# Patient Record
Sex: Female | Born: 1975 | Race: Black or African American | Hispanic: No | Smoking: Former smoker
Health system: Southern US, Community
[De-identification: ages and names within clinical notes are randomized; demographics above are authoritative.]

## PROBLEM LIST (undated history)

## (undated) DIAGNOSIS — J302 Other seasonal allergic rhinitis: Secondary | ICD-10-CM

## (undated) DIAGNOSIS — I10 Essential (primary) hypertension: Secondary | ICD-10-CM

## (undated) DIAGNOSIS — F419 Anxiety disorder, unspecified: Secondary | ICD-10-CM

## (undated) DIAGNOSIS — F32A Depression, unspecified: Secondary | ICD-10-CM

## (undated) HISTORY — PX: BREAST SURGERY: SHX581

## (undated) HISTORY — PX: FOOT SURGERY: SHX648

---

## 2005-01-19 ENCOUNTER — Encounter: Admission: RE | Admit: 2005-01-19 | Discharge: 2005-01-19 | Payer: Self-pay | Admitting: Otolaryngology

## 2005-02-14 ENCOUNTER — Other Ambulatory Visit: Admission: RE | Admit: 2005-02-14 | Discharge: 2005-02-14 | Payer: Self-pay | Admitting: Otolaryngology

## 2010-01-09 DIAGNOSIS — E559 Vitamin D deficiency, unspecified: Secondary | ICD-10-CM | POA: Insufficient documentation

## 2015-07-19 DIAGNOSIS — J301 Allergic rhinitis due to pollen: Secondary | ICD-10-CM | POA: Insufficient documentation

## 2016-03-30 DIAGNOSIS — Z3169 Encounter for other general counseling and advice on procreation: Secondary | ICD-10-CM | POA: Insufficient documentation

## 2016-03-30 DIAGNOSIS — N6019 Diffuse cystic mastopathy of unspecified breast: Secondary | ICD-10-CM | POA: Insufficient documentation

## 2018-03-13 ENCOUNTER — Encounter (INDEPENDENT_AMBULATORY_CARE_PROVIDER_SITE_OTHER): Payer: Medicaid Other

## 2018-03-24 ENCOUNTER — Ambulatory Visit (INDEPENDENT_AMBULATORY_CARE_PROVIDER_SITE_OTHER): Payer: Self-pay | Admitting: Family Medicine

## 2018-03-24 DIAGNOSIS — N6019 Diffuse cystic mastopathy of unspecified breast: Secondary | ICD-10-CM | POA: Diagnosis not present

## 2018-03-24 DIAGNOSIS — Z91018 Allergy to other foods: Secondary | ICD-10-CM | POA: Insufficient documentation

## 2018-03-24 DIAGNOSIS — F438 Other reactions to severe stress: Secondary | ICD-10-CM | POA: Diagnosis not present

## 2018-03-24 DIAGNOSIS — N841 Polyp of cervix uteri: Secondary | ICD-10-CM | POA: Diagnosis not present

## 2018-03-24 DIAGNOSIS — E559 Vitamin D deficiency, unspecified: Secondary | ICD-10-CM | POA: Diagnosis not present

## 2018-03-24 DIAGNOSIS — Z0000 Encounter for general adult medical examination without abnormal findings: Secondary | ICD-10-CM | POA: Diagnosis not present

## 2018-03-24 DIAGNOSIS — S76111S Strain of right quadriceps muscle, fascia and tendon, sequela: Secondary | ICD-10-CM | POA: Diagnosis not present

## 2018-03-24 DIAGNOSIS — J301 Allergic rhinitis due to pollen: Secondary | ICD-10-CM | POA: Diagnosis not present

## 2018-03-24 DIAGNOSIS — M25562 Pain in left knee: Secondary | ICD-10-CM | POA: Diagnosis not present

## 2018-03-26 ENCOUNTER — Encounter (INDEPENDENT_AMBULATORY_CARE_PROVIDER_SITE_OTHER): Payer: Self-pay | Admitting: Family Medicine

## 2018-03-26 ENCOUNTER — Encounter (INDEPENDENT_AMBULATORY_CARE_PROVIDER_SITE_OTHER): Payer: Self-pay

## 2018-03-26 ENCOUNTER — Ambulatory Visit (INDEPENDENT_AMBULATORY_CARE_PROVIDER_SITE_OTHER): Payer: Self-pay | Admitting: Family Medicine

## 2018-04-04 DIAGNOSIS — Z1231 Encounter for screening mammogram for malignant neoplasm of breast: Secondary | ICD-10-CM | POA: Diagnosis not present

## 2018-04-18 ENCOUNTER — Encounter: Payer: Self-pay | Admitting: Rehabilitative and Restorative Service Providers"

## 2018-04-18 ENCOUNTER — Ambulatory Visit: Payer: 59 | Admitting: Rehabilitative and Restorative Service Providers"

## 2018-04-18 DIAGNOSIS — M25561 Pain in right knee: Secondary | ICD-10-CM

## 2018-04-18 DIAGNOSIS — R29898 Other symptoms and signs involving the musculoskeletal system: Secondary | ICD-10-CM | POA: Diagnosis not present

## 2018-04-18 DIAGNOSIS — M6281 Muscle weakness (generalized): Secondary | ICD-10-CM | POA: Diagnosis not present

## 2018-04-18 DIAGNOSIS — G8929 Other chronic pain: Secondary | ICD-10-CM | POA: Diagnosis not present

## 2018-04-18 DIAGNOSIS — M25562 Pain in left knee: Secondary | ICD-10-CM | POA: Diagnosis not present

## 2018-04-18 NOTE — Patient Instructions (Signed)
HIP: Hamstrings - Supine  Place strap around foot. Raise leg up, keeping knee straight.  Bend opposite knee to protect back if indicated. Hold 30 seconds. 3 reps per set, 2-3 sets per day  Outer Hip Stretch: Reclined IT Band Stretch (Strap) Right leg    Strap around one foot, pull leg across body until you feel a pull or stretch in the outside of your hip, with shoulders on mat. Hold for 30 seconds. Repeat 3 times each leg. 2-3 times/day.   For leg leg bring left leg out side 30 sec x 3 reps    Piriformis Stretch   Lying on back, pull right knee toward opposite shoulder. Hold 30 seconds. Repeat 3 times. Do 2-3 sessions per day.    Quads / HF, Prone KNEE: Quadriceps - Prone    Place strap around ankle. Bring ankle toward buttocks. Press hip into surface. Hold 30 seconds. Repeat 3 times per session. Do 2-3 sessions per day.    Avoid standing with knees locked!!!   TENS UNIT: This is helpful for muscle pain and spasm.   Search and Purchase a TENS 7000 2nd edition at www.tenspros.com. It should be less than $30.     TENS unit instructions: Do not shower or bathe with the unit on Turn the unit off before removing electrodes or batteries If the electrodes lose stickiness add a drop of water to the electrodes after they are disconnected from the unit and place on plastic sheet. If you continued to have difficulty, call the TENS unit company to purchase more electrodes. Do not apply lotion on the skin area prior to use. Make sure the skin is clean and dry as this will help prolong the life of the electrodes. After use, always check skin for unusual red areas, rash or other skin difficulties. If there are any skin problems, does not apply electrodes to the same area. Never remove the electrodes from the unit by pulling the wires. Do not use the TENS unit or electrodes other than as directed. Do not change electrode placement without consultating your therapist or  physician. Keep 2 fingers with between each electrode.

## 2018-04-18 NOTE — Therapy (Addendum)
[facility] [place], [place], [address] Phone: [phone]   Fax:  [phone]  Physical Therapy Evaluation  Patient Details  Name: Ann Gordon MRN: [id number] Date of Birth: [birth year] Referring Provider: Dr Sharyne Richters    Encounter Date: 04/18/2018  PT End of Session - 04/18/18 1441    Visit Number  1    Number of Visits  12    Date for PT Re-Evaluation  05/30/18    PT Start Time  1435    PT Stop Time  1542    PT Time Calculation (min)  67 min    Activity Tolerance  Patient tolerated treatment well       History reviewed. No pertinent past medical history.  History reviewed. No pertinent surgical history.  There were no vitals filed for this visit.   Subjective Assessment - 04/18/18 1343    Subjective  Injury to Lt knee ~ 2 yrs ago when he jumped down from about 6 ft height when she felt some pain in the Lt knee, pain increased a few days when she was doing lunges - treated with cortisone injection with some improvement for several months. She has now had increased pain over the past 5 months. Patient injured Rt knee ~ 1 yr ago with lunges during exercis with diagnosis of torn quad. This was treated with immobilizer and  bed rest for ~ 3-4 weeks with some improvement  and out of work for 2-3 months. Pain improved but started again when she lifted her father from the floor 3/19. Both knees are now hurting.     How long can you sit comfortably?  no limit    How long can you stand comfortably?  45 min     How long can you walk comfortably?  2 hours    Patient Stated Goals  strengthen knees - get rid of the pain     Currently in Pain?  Yes    Pain Score  7     Pain Location  Knee    Pain Orientation  Right    Pain Descriptors / Indicators  Burning;Nagging    Pain Type  Chronic pain    Pain Radiating Towards  lateral knee into anterior/lateral ankle     Pain Onset  More than a month ago    Pain  Frequency  Intermittent    Aggravating Factors   working all day - 8 hours; lifting    Pain Relieving Factors  rest; ice     Multiple Pain Sites  Yes    Pain Score  5    Pain Location  Knee    Pain Orientation  Left    Pain Descriptors / Indicators  Dull;Aching    Pain Type  Chronic pain    Pain Radiating Towards  medial knee area no radicular pain     Pain Onset  More than a month ago    Pain Frequency  Intermittent    Aggravating Factors   working 8 hr day; lifting     Pain Relieving Factors  rest; ice          [facility] PT Assessment - 04/18/18 0001      Assessment   Medical Diagnosis  Rt quad strain; Lt knee medial meniscus strain     Referring Provider  Dr Sharyne Richters     Onset Date/Surgical Date  01/17/18    Hand Dominance  Right    Next MD Visit  9/19    Prior Therapy  none       Precautions   Precautions  None      Balance Screen   Has the patient fallen in the past 6 months  No    Has the patient had a decrease in activity level because of a fear of falling?   No    Is the patient reluctant to leave their home because of a fear of falling?   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     Leisure  household chores; bowling      Observation/Other Assessments   Focus on Therapeutic Outcomes (FOTO)   20% limitation       Observation/Other Assessments-Edema    Edema  -- occ swelling Rt knee       Sensation   Additional Comments  WFL's per pt report       AROM   Right/Left Hip  -- tight rt rotation     Right Knee Extension  3    Right Knee Flexion  137    Left Knee Extension  5    Left Knee Flexion  127    Right/Left Ankle  --  [facility]       Strength   Right Hip Flexion  -- 5-/5    Right Hip Extension  -- 5-/5    Right Hip ABduction  -- 5-/5    Right Hip ADduction  5/5    Left Hip Flexion  5/5    Left Hip Extension  5/5    Left Hip ABduction  4+/5    Left Hip ADduction  -- 5-/5    Right Knee Flexion  4+/5    Right Knee Extension  4+/5    Left Knee Flexion  5/5    Left Knee Extension  5/5    Right/Left Ankle  -- WFL's bilat       Flexibility   Hamstrings  tight ~ 80 deg bilat     Quadriceps  tight bilat ~ 6 inch heel to buttocks     ITB  tight Rt    Piriformis  tight Rt       Palpation   Palpation comment  tight/banding lateral Rt quad proximal to joint line; medial Lt knee at insertion of adductors                 Objective measurements completed on examination: See above findings.      [place] Adult PT Treatment/Exercise - 04/18/18 0001      Self-Care   Self-Care  -- avoid hyperextension in standing; avoid crossing legs      Knee/Hip Exercises: Stretches   Passive Hamstring Stretch  Right;Left;2 reps;30 seconds supine with strap    Quad Stretch  Right;Left;2 reps;30 seconds prone with strap     ITB Stretch  Right;2 reps;30 seconds supine with strap     Piriformis Stretch  Right;Left;2 reps;30 seconds supine with strap     Other Knee/Hip Stretches  hip adductor stretch Lt supine with strap  Cryotherapy   Number Minutes Cryotherapy  20 Minutes    Cryotherapy Location  Knee bilat     Type of Cryotherapy  Ice pack      Electrical Stimulation   Electrical Stimulation Location  Rt lat knee; Lt medial knee     Electrical Stimulation Action  premod    Electrical Stimulation Parameters  to tolerance     Electrical Stimulation Goals  Pain;Tone             PT Education - 04/18/18 1432    Education Details  HEP TENS avoid crossing LE's avoid standing with knees hyperextended     Person(s) Educated  Patient    Methods  Explanation;Demonstration;Verbal cues;Handout     Comprehension  Verbalized understanding;Returned demonstration;Verbal cues required;Tactile cues required          PT Long Term Goals - 04/18/18 1446      PT LONG TERM GOAL #1   Title  Decrease pain in bilat knees to no greater than 2/10 with functional activities and at the end of her work day 05/30/18    Time  6    Period  Weeks    Status  New      PT LONG TERM GOAL #2   Title  Increase strength bilat LE's to 5/1 hips/knees with no pain with resistive testing 05/30/18    Time  6    Period  Weeks    Status  New      PT LONG TERM GOAL #3   Title  Improve tissue extensibilty with patient to demonstrate SLR to 85 deg bilat 05/30/18    Time  6    Period  Weeks    Status  New      PT LONG TERM GOAL #4   Title  Independent in HEP  05/30/18    Time  6    Period  Weeks    Status  New      PT LONG TERM GOAL #5   Title  Improve FOTO to </= 21% limiation 06/08/18    Time  6    Period  Weeks    Status  New             Plan - 04/18/18 1442    Clinical Impression Statement  Patient presents with injury to Rt/Lt knees 1-2 yrs ago with flare up in the past 3 months. Patient has lateral Rt knee pain with dx of quad tear/strain; medial Lt knee pain with dx of medial meniscus strain. She has muscular tightness bilat hips; weakness bilat LE's; muscular tightness and pain with palpation; pain with functional activities. Patient will benefit form PT to address problems identified.     Clinical Presentation  Stable    Clinical Presentation due to:  chronic nature of problems     Clinical Decision Making  Low    Rehab Potential  Good    PT Frequency  2x / week    PT Duration  6 weeks    PT Treatment/Interventions  Patient/family education;ADLs/Self Care Home Management;Cryotherapy;Electrical Stimulation;Iontophoresis 80m/ml Dexamethasone;Moist Heat;Ultrasound;Dry needling;Manual techniques;Neuromuscular re-education;Functional mobility training;Therapeutic activities;Therapeutic exercise     PT Next Visit Plan  review HEP; add strengthening exercises as tolerated; modalities and manual work as indicated     COncologistwith Plan of Care  Patient       Patient will benefit from skilled therapeutic intervention in order to improve the following deficits and impairments:  Postural dysfunction, Improper body mechanics, Pain,  Increased fascial restricitons, Increased muscle spasms, Decreased mobility, Decreased range of motion, Decreased strength, Decreased activity tolerance  Visit Diagnosis: Chronic pain of right knee - Plan: PT plan of care cert/re-cert  Chronic pain of left knee - Plan: PT plan of care cert/re-cert  Muscle weakness (generalized) - Plan: PT plan of care cert/re-cert  Other symptoms and signs involving the musculoskeletal system - Plan: PT plan of care cert/re-cert     Problem List There are no active problems to display for this patient.   [place], MPH  04/18/2018, 4:18 PM  [facility] [place] [address] [place], [place], [address] Phone: [phone]   Fax:  [phone]  Name: Ann Gordon MRN: [id number] Date of Birth: 25-Nov-1975   PHYSICAL THERAPY DISCHARGE SUMMARY  Visits from Start of Care: Eval only   Current functional level related to goals / functional outcomes: See note for discharge status    Remaining deficits: Unknown    Education / Equipment: HEP  Plan: Patient agrees to discharge.  Patient goals were not met. Patient is being discharged due to not returning since the last visit.  ?????     Celyn P. Helene Kelp PT, MPH 05/14/18 12:13 PM

## 2018-04-25 DIAGNOSIS — N6012 Diffuse cystic mastopathy of left breast: Secondary | ICD-10-CM | POA: Diagnosis not present

## 2018-04-25 DIAGNOSIS — R922 Inconclusive mammogram: Secondary | ICD-10-CM | POA: Diagnosis not present

## 2018-04-25 DIAGNOSIS — R928 Other abnormal and inconclusive findings on diagnostic imaging of breast: Secondary | ICD-10-CM | POA: Diagnosis not present

## 2018-04-25 DIAGNOSIS — N6002 Solitary cyst of left breast: Secondary | ICD-10-CM | POA: Diagnosis not present

## 2018-05-02 DIAGNOSIS — J309 Allergic rhinitis, unspecified: Secondary | ICD-10-CM | POA: Diagnosis not present

## 2018-05-09 ENCOUNTER — Encounter: Payer: Medicaid Other | Admitting: Rehabilitative and Restorative Service Providers"

## 2018-05-09 DIAGNOSIS — J301 Allergic rhinitis due to pollen: Secondary | ICD-10-CM | POA: Diagnosis not present

## 2018-05-20 ENCOUNTER — Ambulatory Visit (INDEPENDENT_AMBULATORY_CARE_PROVIDER_SITE_OTHER): Payer: Self-pay | Admitting: Family Medicine

## 2018-05-20 VITALS — BP 118/82 | HR 86 | Temp 99.5°F | Resp 20 | Wt 190.8 lb

## 2018-05-20 DIAGNOSIS — M25562 Pain in left knee: Secondary | ICD-10-CM

## 2018-05-20 DIAGNOSIS — M25462 Effusion, left knee: Secondary | ICD-10-CM

## 2018-05-20 MED ORDER — MELOXICAM 15 MG PO TABS: 15.0000 mg | ORAL_TABLET | Freq: Every day | ORAL | 0 refills | Status: DC

## 2018-05-20 MED ORDER — TRAMADOL HCL 50 MG PO TABS
50.0000 mg | ORAL_TABLET | Freq: Three times a day (TID) | ORAL | 0 refills | Status: DC | PRN
Start: 2018-05-20 — End: 2020-02-26

## 2018-05-20 NOTE — Patient Instructions (Signed)

## 2018-05-20 NOTE — Progress Notes (Signed)
Ann Gordon is a 41 y.o. female who presents today with concerns of left knee pain she reports began after a round of physical therapy to address a chronic injury to this knee that occurred 2 years ago. History was complicated by risk factors of length of injury that was initially traumatic and inflexible work schedule for appointments per patient reports which limited f/u initially after swelling began. Since the injury the patient has used a brace at home but not while working where much of stress and activity is occurring.She is ambulating unassisted without antalgic gait.   Review of Systems  Constitutional: Negative for chills, fever and malaise/fatigue.  HENT: Negative for congestion, ear discharge, ear pain, sinus pain and sore throat.   Eyes: Negative.   Respiratory: Negative for cough, sputum production and shortness of breath.   Cardiovascular: Negative.  Negative for chest pain.  Gastrointestinal: Negative for abdominal pain, diarrhea, nausea and vomiting.  Genitourinary: Negative for dysuria, frequency, hematuria and urgency.  Musculoskeletal: Negative for myalgias.  Skin: Negative.   Neurological: Negative for headaches.  Endo/Heme/Allergies: Negative.   Psychiatric/Behavioral: Negative.     O: Vitals:   05/20/18 1831  BP: 118/82  Pulse: 86  Resp: 20  Temp: 99.5 F (37.5 C)  SpO2: 98%     Physical Exam  Constitutional: She is oriented to person, place, and time. Vital signs are normal. She appears well-developed and well-nourished. She is active.  Non-toxic appearance. She does not have a sickly appearance.    Red- areas of swelling Blue- pinpoint area of pain  HENT:  Head: Normocephalic.  Right Ear: Hearing, tympanic membrane, external ear and ear canal normal.  Left Ear: Hearing, tympanic membrane, external ear and ear canal normal.  Nose: Nose normal.  Mouth/Throat: Uvula is midline and oropharynx is clear and moist.  Neck: Normal range of motion. Neck  supple.  Cardiovascular: Normal rate, regular rhythm, normal heart sounds and normal pulses.  Pulmonary/Chest: Effort normal and breath sounds normal.  Abdominal: Soft. Bowel sounds are normal.  Musculoskeletal: Normal range of motion.       Left knee: She exhibits swelling. Tenderness found. Medial joint line tenderness noted.  Patient with visual edema on exam that extends to foot- swelling makes 10-15% larger than unaffected leg on exam. No evidence of fluid shift- only pain point elicited is medial joint line tenderness limited to the anterior medial side. Posterior exam is unremarkable patient has full ROM and negative special test of lachman and drawer, patella apprehension negative and no patella laxity.  Lymphadenopathy:       Head (right side): No submental and no submandibular adenopathy present.       Head (left side): No submental and no submandibular adenopathy present.    She has no cervical adenopathy.  Neurological: She is alert and oriented to person, place, and time.  Psychiatric: She has a normal mood and affect.  Vitals reviewed.    A: 1. Acute pain of left knee   2. Pain and swelling of knee, left      P:  Advised f/u with local orthopedic due to reported historic meniscus injury and potential strain developed in these past weeks. Will provide support care in the form of NSAIDs and break through pain, enforcing rice therapy and use of supportive brace for support.  Exam findings, diagnosis etiology and medication use and indications reviewed with patient. Follow- Up and discharge instructions provided. No emergent/urgent issues found on exam.  Patient verbalized understanding of information provided  and agrees with plan of care (POC), all questions answered.  1. Acute pain of left knee - meloxicam (MOBIC) 15 MG tablet; Take 1 tablet (15 mg total) by mouth daily. - traMADol (ULTRAM) 50 MG tablet; Take 1 tablet (50 mg total) by mouth every 8 (eight) hours as needed  for moderate pain.  2. Pain and swelling of knee, left - meloxicam (MOBIC) 15 MG tablet; Take 1 tablet (15 mg total) by mouth daily. - traMADol (ULTRAM) 50 MG tablet; Take 1 tablet (50 mg total) by mouth every 8 (eight) hours as needed for moderate pain.

## 2018-05-21 MED FILL — traMADol HCL 50 MG TABS: 50 | 5 days supply | Qty: 15 | Fill #0

## 2018-05-21 MED FILL — MELOXICAM 15 MG TABLET: 15 | 30 days supply | Qty: 30 | Fill #0

## 2018-05-22 ENCOUNTER — Emergency Department
Admission: EM | Admit: 2018-05-22 | Discharge: 2018-05-22 | Disposition: A | Discharge status: Home / Self Care | Payer: 59 | Source: Home / Self Care | Attending: Family Medicine | Admitting: Family Medicine

## 2018-05-22 ENCOUNTER — Emergency Department (INDEPENDENT_AMBULATORY_CARE_PROVIDER_SITE_OTHER): Payer: 59

## 2018-05-22 ENCOUNTER — Encounter: Payer: Self-pay | Admitting: *Deleted

## 2018-05-22 ENCOUNTER — Other Ambulatory Visit: Payer: Self-pay

## 2018-05-22 DIAGNOSIS — M1712 Unilateral primary osteoarthritis, left knee: Secondary | ICD-10-CM

## 2018-05-22 DIAGNOSIS — M25462 Effusion, left knee: Secondary | ICD-10-CM | POA: Diagnosis not present

## 2018-05-22 DIAGNOSIS — M25562 Pain in left knee: Secondary | ICD-10-CM

## 2018-05-22 HISTORY — DX: Other seasonal allergic rhinitis: J30.2

## 2018-05-22 MED ORDER — HYDROCODONE-ACETAMINOPHEN 5-325 MG PO TABS: 1.0000 | ORAL_TABLET | ORAL | 0 refills | Status: DC | PRN

## 2018-05-22 MED ORDER — CYCLOBENZAPRINE HCL 10 MG PO TABS: 10.0000 mg | ORAL_TABLET | Freq: Two times a day (BID) | ORAL | 0 refills | Status: DC | PRN

## 2018-05-22 NOTE — ED Triage Notes (Signed)
Pt reports a hx of medial meniscus tear. She began PT 4 wks ago. She c/o increased LT knee pain, swelling, and weakness x 3 wks. [facility]. After wearing the brace yesterday last night she reports severe leg cramps.

## 2018-05-22 NOTE — ED Provider Notes (Signed)
[facility]    CSN: [id number] Arrival date & time: 05/22/18  0856     History   Chief Complaint Chief Complaint  Patient presents with  . Knee Pain    HPI Ann Gordon is a 42 y.o. female.   HPI  Ann Gordon is a 42 y.o. female presenting to UC with c/o Left medial knee pain that started about 4 weeks ago after 1 PT session. She had a medial meniscal tear about 2 years ago. It was recommended she have PT but she only received 1 knee injection and pain eventually resolved.  About 1 month ago she noticed her knee felt "weak" so she had her PCP schedule a PT appointment. She has been having pain since then.  She was seen at [facility] on 05/20/18, given a knee brace but had leg cramps last night after wearing the brace. She has tried Tramadol and Mobic with temporary relief. No new injuries.   Past Medical History:  Diagnosis Date  . Seasonal allergies     There are no active problems to display for this patient.   History reviewed. No pertinent surgical history.  OB History   None      Home Medications    Prior to Admission medications   Medication Sig Start Date End Date Taking? Authorizing Provider  levocetirizine (XYZAL) 5 MG tablet Take 5 mg by mouth every evening.   Yes [provider]  Multiple Vitamin (MULTIVITAMIN) tablet Take 1 tablet by mouth daily.   Yes [provider]  cyclobenzaprine (FLEXERIL) 10 MG tablet Take 1 tablet (10 mg total) by mouth 2 (two) times daily as needed. 05/22/18   Noe Gens, PA-C  HYDROcodone-acetaminophen (NORCO/VICODIN) 5-325 MG tablet Take 1 tablet by mouth every 4 (four) hours as needed. 05/22/18   Noe Gens, PA-C  meloxicam (MOBIC) 15 MG tablet Take 1 tablet (15 mg total) by mouth daily. 05/20/18   Shella Maxim, NP  traMADol (ULTRAM) 50 MG tablet Take 1 tablet (50 mg total) by mouth every 8 (eight) hours as needed for moderate pain. 05/20/18   Shella Maxim, NP    Family History Family History   Problem Relation Age of Onset  . Hypertension Mother   . Hypertension Father   . Diabetes Father     Social History Social History   Tobacco Use  . Smoking status: Former Research scientist (life sciences)  . Smokeless tobacco: Never Used  Substance Use Topics  . Alcohol use: Yes    Alcohol/week: 1.2 - 1.8 oz    Types: 2 - 3 Cans of beer per week  . Drug use: Never     Allergies   Cashew nut oil and Other   Review of Systems Review of Systems  Musculoskeletal: Positive for arthralgias and joint swelling. Negative for myalgias.  Skin: Negative for color change and wound.     Physical Exam Triage Vital Signs ED Triage Vitals [05/22/18 0929]  Enc Vitals Group     BP (!) 158/96     Pulse Rate 89     Resp 18     Temp 98.8 F (37.1 C)     Temp Source Oral     SpO2 100 %     Weight 190 lb (86.2 kg)     Height 5\' 3"  (1.6 m)     Head Circumference      Peak Flow      Pain Score 7     Pain Loc  Pain Edu?      Excl. in [place]?    No data found.  Updated Vital Signs BP (!) 158/96 (BP Location: Right Arm)   Pulse 89   Temp 98.8 F (37.1 C) (Oral)   Resp 18   Ht 5\' 3"  (1.6 m)   Wt 190 lb (86.2 kg)   LMP 05/14/2018   SpO2 100%   BMI 33.66 kg/m   Visual Acuity Right Eye Distance:   Left Eye Distance:   Bilateral Distance:    Right Eye Near:   Left Eye Near:    Bilateral Near:     Physical Exam  Constitutional: She is oriented to person, place, and time. She appears well-developed and well-nourished. No distress.  HENT:  Head: Normocephalic and atraumatic.  Eyes: EOM are normal.  Neck: Normal range of motion.  Cardiovascular: Normal rate.  Pulmonary/Chest: Effort normal.  Musculoskeletal: Normal range of motion. She exhibits edema and tenderness.  Left knee: mild edema. Tenderness to medial and posterior aspect. Full ROM. No crepitus.  Calf is soft, non-tender.  Neurological: She is alert and oriented to person, place, and time.  Skin: Skin is warm and dry. She is not  diaphoretic.  Left knee: skin in tact. No ecchymosis or erythema.   Psychiatric: She has a normal mood and affect. Her behavior is normal.  Nursing note and vitals reviewed.    UC Treatments / Results  Labs (all labs ordered are listed, but only abnormal results are displayed) Labs Reviewed - No data to display  EKG None  Radiology Dg Knee Complete 4 Views Left  Result Date: 05/22/2018 CLINICAL DATA:  Left knee pain and swelling over the last 3 weeks. Jumping injury. EXAM: LEFT KNEE - COMPLETE 4+ VIEW COMPARISON:  None. FINDINGS: Mild medial compartment joint space narrowing of marginal osteophytes. Small knee joint effusion. No other focal finding. IMPRESSION: Mild medial compartment osteoarthritic changes. Small joint effusion. No specific traumatic finding. Electronically Signed   By: Nelson Chimes M.D.   On: 05/22/2018 09:58    Procedures Procedures (including critical care time)  Medications Ordered in UC Medications - No data to display  Initial Impression / Assessment and Plan / UC Course  I have reviewed the triage vital signs and the nursing notes.  Pertinent labs & imaging results that were available during my care of the patient were reviewed by me and considered in my medical decision making (see chart for details).     Discussed imaging with pt.  Advised not to use knee brace anymore due to brace making pain worse. Pt declined crutches.  Home care instructions provided below.   Final Clinical Impressions(s) / UC Diagnoses   Final diagnoses:  Pain and swelling of left knee  Effusion of left knee     Discharge Instructions      Norco/Vicodin (hydrocodone-acetaminophen) is a narcotic pain medication, do not combine these medications with others containing tylenol. While taking, do not drink alcohol, drive, or perform any other activities that requires focus while taking these medications.   Flexeril is a muscle relaxer and may cause drowsiness. Do not drink  alcohol, drive, or operate heavy machinery while taking.  Do NOT take the Tramdaol while taking Hydrocodone, take one or the other as this can increase risk of accidental overdose.   Please call Dr. Landry Corporal office tomorrow for further evaluation and treatment.    ED Prescriptions    Medication Sig Dispense Auth. Provider   cyclobenzaprine (FLEXERIL) 10 MG tablet Take  1 tablet (10 mg total) by mouth 2 (two) times daily as needed. 20 tablet Noe Gens, PA-C   HYDROcodone-acetaminophen (NORCO/VICODIN) 5-325 MG tablet Take 1 tablet by mouth every 4 (four) hours as needed. 10 tablet Noe Gens, PA-C     Controlled Substance Prescriptions [place] Controlled Substance Registry consulted? Yes, I have consulted the [place] Controlled Substances Registry for this patient, and feel the risk/benefit ratio today is favorable for proceeding with this prescription for a controlled substance.   Noe Gens, [place] 05/22/18 1142

## 2018-05-22 NOTE — Discharge Instructions (Signed)
°  Norco/Vicodin (hydrocodone-acetaminophen) is a narcotic pain medication, do not combine these medications with others containing tylenol. While taking, do not drink alcohol, drive, or perform any other activities that requires focus while taking these medications.   Flexeril is a muscle relaxer and may cause drowsiness. Do not drink alcohol, drive, or operate heavy machinery while taking.  Do NOT take the Tramdaol while taking Hydrocodone, take one or the other as this can increase risk of accidental overdose.   Please call Dr. Landry Corporal office tomorrow for further evaluation and treatment.

## 2018-05-30 ENCOUNTER — Ambulatory Visit (INDEPENDENT_AMBULATORY_CARE_PROVIDER_SITE_OTHER): Payer: 59 | Admitting: Sports Medicine

## 2018-05-30 ENCOUNTER — Encounter: Payer: Self-pay | Admitting: Sports Medicine

## 2018-05-30 DIAGNOSIS — M1712 Unilateral primary osteoarthritis, left knee: Secondary | ICD-10-CM | POA: Diagnosis not present

## 2018-05-30 DIAGNOSIS — M170 Bilateral primary osteoarthritis of knee: Secondary | ICD-10-CM | POA: Insufficient documentation

## 2018-05-30 MED ORDER — MELOXICAM 15 MG PO TABS: ORAL_TABLET | ORAL | 3 refills | Status: DC

## 2018-05-30 NOTE — Progress Notes (Signed)
Subjective:    I'm seeing this patient as a consultation for: Dr. Sharyne Richters  CC: Left knee pain  HPI: This is a pleasant 42 year old female post partial meniscectomy on the left, more recently had a recurrence of pain, medial joint line with swelling, was seen in urgent care, she has been taking some ibuprofen without much relief.  Pain is moderate, persistent, localized at the medial joint line without radiation, no mechanical symptoms.  Swelling has improved with a compression wrap.   I reviewed the past medical history, family history, social history, surgical history, and allergies today and no changes were needed.  Please see the problem list section below in epic for further details.  Past Medical History: Past Medical History:  Diagnosis Date  . Seasonal allergies    Past Surgical History: No past surgical history on file. Social History: Social History   Socioeconomic History  . Marital status: Single    Spouse name: Not on file  . Number of children: Not on file  . Years of education: Not on file  . Highest education level: Not on file  Occupational History  . Not on file  Social Needs  . Financial resource strain: Not on file  . Food insecurity:    Worry: Not on file    Inability: Not on file  . Transportation needs:    Medical: Not on file    Non-medical: Not on file  Tobacco Use  . Smoking status: Former Research scientist (life sciences)  . Smokeless tobacco: Never Used  Substance and Sexual Activity  . Alcohol use: Yes    Alcohol/week: 1.2 - 1.8 oz    Types: 2 - 3 Cans of beer per week  . Drug use: Never  . Sexual activity: Not on file  Lifestyle  . Physical activity:    Days per week: Not on file    Minutes per session: Not on file  . Stress: Not on file  Relationships  . Social connections:    Talks on phone: Not on file    Gets together: Not on file    Attends religious service: Not on file    Active member of club or organization: Not on file    Attends meetings  of clubs or organizations: Not on file    Relationship status: Not on file  Other Topics Concern  . Not on file  Social History Narrative  . Not on file   Family History: Family History  Problem Relation Age of Onset  . Hypertension Mother   . Hypertension Father   . Diabetes Father    Allergies: Allergies  Allergen Reactions  . Cashew Nut Oil Anaphylaxis and Itching  . Other    Medications: See med rec.  Review of Systems: No headache, visual changes, nausea, vomiting, diarrhea, constipation, dizziness, abdominal pain, skin rash, fevers, chills, night sweats, weight loss, swollen lymph nodes, body aches, joint swelling, muscle aches, chest pain, shortness of breath, mood changes, visual or auditory hallucinations.   Objective:   General: Well Developed, well nourished, and in no acute distress.  Neuro:  Extra-ocular muscles intact, able to move all 4 extremities, sensation grossly intact.  Deep tendon reflexes tested were normal. Psych: Alert and oriented, mood congruent with affect. ENT:  Ears and nose appear unremarkable.  Hearing grossly normal. Neck: Unremarkable overall appearance, trachea midline.  No visible thyroid enlargement. Eyes: Conjunctivae and lids appear unremarkable.  Pupils equal and round. Skin: Warm and dry, no rashes noted.  Cardiovascular: Pulses palpable,  no extremity edema. Left knee: Minimal effusion with mild tenderness at the medial joint line ROM normal in flexion and extension and lower leg rotation. Ligaments with solid consistent endpoints including ACL, PCL, LCL, MCL. Negative Mcmurray's and provocative meniscal tests. Non painful patellar compression. Patellar and quadriceps tendons unremarkable. Hamstring and quadriceps strength is normal.  Procedure: Real-time Ultrasound Guided Injection of left knee Device: GE Logiq E  Verbal informed consent obtained.  Time-out conducted.  Noted no overlying erythema, induration, or other signs of  local infection.  Skin prepped in a sterile fashion.  Local anesthesia: Topical Ethyl chloride.  With sterile technique and under real time ultrasound guidance: Only minimal effusion, 25-gauge needle advanced into the suprapatellar recess, I then injected 1 cc Kenalog 40, 2 cc lidocaine, 2 cc bupivacaine. Completed without difficulty  Pain immediately resolved suggesting accurate placement of the medication.  Advised to call if fevers/chills, erythema, induration, drainage, or persistent bleeding.  Images permanently stored and available for review in the ultrasound unit.  Impression: Technically successful ultrasound guided injection.  Impression and Recommendations:   This case required medical decision making of moderate complexity.  Primary osteoarthritis of left knee Mild effusion, osteoarthritis on x-rays. Ibuprofen not sufficiently efficacious, injection as above, she will get an over-the-counter knee brace, rehab exercises given.   Meloxicam. Return to see me in 1 month. ___________________________________________ Gwen Her. Dianah Field, M.D., ABFM., CAQSM. Primary Care and [place] Instructor of [place] of [facility] of Medicine

## 2018-05-30 NOTE — Assessment & Plan Note (Signed)
Mild effusion, osteoarthritis on x-rays. Ibuprofen not sufficiently efficacious, injection as above, she will get an over-the-counter knee brace, rehab exercises given.   Meloxicam. Return to see me in 1 month.

## 2018-06-04 DIAGNOSIS — Z91018 Allergy to other foods: Secondary | ICD-10-CM | POA: Diagnosis not present

## 2018-06-04 DIAGNOSIS — Z319 Encounter for procreative management, unspecified: Secondary | ICD-10-CM | POA: Diagnosis not present

## 2018-06-04 DIAGNOSIS — N840 Polyp of corpus uteri: Secondary | ICD-10-CM | POA: Diagnosis not present

## 2018-06-04 DIAGNOSIS — J301 Allergic rhinitis due to pollen: Secondary | ICD-10-CM | POA: Diagnosis not present

## 2018-06-17 ENCOUNTER — Telehealth: Payer: Self-pay

## 2018-06-17 MED ORDER — DICLOFENAC SODIUM 1 % TD GEL: 4.0000 g | Freq: Four times a day (QID) | TRANSDERMAL | 11 refills | Status: DC

## 2018-06-17 MED FILL — DICLOFENAC SODIUM 1% GEL: 1 | 6 days supply | Qty: 100 | Fill #0

## 2018-06-17 NOTE — Telephone Encounter (Signed)
Dr Dianah Field patient.  Ann Gordon called and states the Mobic is helping a little. She would like to switch to diclofenac. She also still has swelling and wanted to know if there is something she can take for the swelling. Please advise.

## 2018-06-17 NOTE — Telephone Encounter (Signed)
Left VM with status update.  

## 2018-06-17 NOTE — Telephone Encounter (Signed)
Diclofenac gel sent to was in the long outpatient pharmacy.

## 2018-06-25 DIAGNOSIS — J3089 Other allergic rhinitis: Secondary | ICD-10-CM | POA: Diagnosis not present

## 2018-06-25 DIAGNOSIS — J3081 Allergic rhinitis due to animal (cat) (dog) hair and dander: Secondary | ICD-10-CM | POA: Diagnosis not present

## 2018-06-25 DIAGNOSIS — J301 Allergic rhinitis due to pollen: Secondary | ICD-10-CM | POA: Diagnosis not present

## 2018-06-27 ENCOUNTER — Ambulatory Visit (INDEPENDENT_AMBULATORY_CARE_PROVIDER_SITE_OTHER): Payer: 59 | Admitting: Sports Medicine

## 2018-06-27 ENCOUNTER — Encounter: Payer: Self-pay | Admitting: Sports Medicine

## 2018-06-27 DIAGNOSIS — M1712 Unilateral primary osteoarthritis, left knee: Secondary | ICD-10-CM

## 2018-06-27 DIAGNOSIS — G8929 Other chronic pain: Secondary | ICD-10-CM | POA: Diagnosis not present

## 2018-06-27 DIAGNOSIS — M25562 Pain in left knee: Secondary | ICD-10-CM

## 2018-06-27 MED ORDER — DICLOFENAC SODIUM 75 MG PO TBEC: 75.0000 mg | DELAYED_RELEASE_TABLET | Freq: Two times a day (BID) | ORAL | 3 refills | Status: AC

## 2018-06-27 NOTE — Progress Notes (Signed)
Subjective:    CC: Recheck knee  HPI: This is a pleasant 42 year old female, she has knee osteoarthritis, we did an aspiration and injection about a month ago, she was feeling pretty good, had a session in physical therapy that was somewhat aggressive, then developed a recurrence of pain and swelling.  Moderate, persistent, no mechanical symptoms.  She does also desire that we switch to diclofenac from meloxicam.  I reviewed the past medical history, family history, social history, surgical history, and allergies today and no changes were needed.  Please see the problem list section below in epic for further details.  Past Medical History: Past Medical History:  Diagnosis Date  . Seasonal allergies    Past Surgical History: No past surgical history on file. Social History: Social History   Socioeconomic History  . Marital status: Single    Spouse name: Not on file  . Number of children: Not on file  . Years of education: Not on file  . Highest education level: Not on file  Occupational History  . Not on file  Social Needs  . Financial resource strain: Not on file  . Food insecurity:    Worry: Not on file    Inability: Not on file  . Transportation needs:    Medical: Not on file    Non-medical: Not on file  Tobacco Use  . Smoking status: Former Research scientist (life sciences)  . Smokeless tobacco: Never Used  Substance and Sexual Activity  . Alcohol use: Yes    Alcohol/week: 2.0 - 3.0 standard drinks    Types: 2 - 3 Cans of beer per week  . Drug use: Never  . Sexual activity: Not on file  Lifestyle  . Physical activity:    Days per week: Not on file    Minutes per session: Not on file  . Stress: Not on file  Relationships  . Social connections:    Talks on phone: Not on file    Gets together: Not on file    Attends religious service: Not on file    Active member of club or organization: Not on file    Attends meetings of clubs or organizations: Not on file    Relationship status: Not  on file  Other Topics Concern  . Not on file  Social History Narrative  . Not on file   Family History: Family History  Problem Relation Age of Onset  . Hypertension Mother   . Hypertension Father   . Diabetes Father    Allergies: Allergies  Allergen Reactions  . Cashew Nut Oil Anaphylaxis and Itching  . Other    Medications: See med rec.  Review of Systems: No fevers, chills, night sweats, weight loss, chest pain, or shortness of breath.   Objective:    General: Well Developed, well nourished, and in no acute distress.  Neuro: Alert and oriented x3, extra-ocular muscles intact, sensation grossly intact.  HEENT: Normocephalic, atraumatic, pupils equal round reactive to light, neck supple, no masses, no lymphadenopathy, thyroid nonpalpable.  Skin: Warm and dry, no rashes. Cardiac: Regular rate and rhythm, no murmurs rubs or gallops, no lower extremity edema.  Respiratory: Clear to auscultation bilaterally. Not using accessory muscles, speaking in full sentences. Left knee: Normal to inspection with no erythema or effusion or obvious bony abnormalities. Moderate effusion with a palpable fluid wave ROM normal in flexion and extension and lower leg rotation. Ligaments with solid consistent endpoints including ACL, PCL, LCL, MCL. Negative Mcmurray's and provocative meniscal tests. Non painful  patellar compression. Patellar and quadriceps tendons unremarkable. Hamstring and quadriceps strength is normal.  Procedure: Real-time Ultrasound Guided aspiration of left knee Device: GE Logiq E  Verbal informed consent obtained.  Time-out conducted.  Noted no overlying erythema, induration, or other signs of local infection.  Skin prepped in a sterile fashion.  Local anesthesia: Topical Ethyl chloride.  With sterile technique and under real time ultrasound guidance: Using an 18-gauge needle I advanced into the suprapatellar recess and aspirated 37 mL of clear, straw-colored  fluid. Completed without difficulty  Pain immediately resolved suggesting accurate placement of the medication.  Advised to call if fevers/chills, erythema, induration, drainage, or persistent bleeding.  Images permanently stored and available for review in the ultrasound unit.  Impression: Technically successful ultrasound guided injection.  The knee was then strapped with a compressive dressing  Impression and Recommendations:    Primary osteoarthritis of left knee Severe recurrence of effusion, arthrocentesis today. MRI, switching to oral diclofenac.   ___________________________________________ Gwen Her. Dianah Field, M.D., ABFM., CAQSM. Primary Care and [place] Instructor of [place] of [facility] of Medicine

## 2018-06-27 NOTE — Assessment & Plan Note (Signed)
Severe recurrence of effusion, arthrocentesis today. MRI, switching to oral diclofenac.

## 2018-07-05 ENCOUNTER — Ambulatory Visit (HOSPITAL_BASED_OUTPATIENT_CLINIC_OR_DEPARTMENT_OTHER): Payer: 59

## 2018-07-12 ENCOUNTER — Ambulatory Visit (HOSPITAL_BASED_OUTPATIENT_CLINIC_OR_DEPARTMENT_OTHER)
Admission: RE | Admit: 2018-07-12 | Discharge: 2018-07-12 | Disposition: A | Discharge status: Home / Self Care | Payer: 59 | Source: Ambulatory Visit | Attending: Sports Medicine | Admitting: Sports Medicine

## 2018-07-12 DIAGNOSIS — M1712 Unilateral primary osteoarthritis, left knee: Secondary | ICD-10-CM | POA: Insufficient documentation

## 2018-07-12 DIAGNOSIS — S83242A Other tear of medial meniscus, current injury, left knee, initial encounter: Secondary | ICD-10-CM | POA: Diagnosis not present

## 2018-07-12 DIAGNOSIS — X58XXXA Exposure to other specified factors, initial encounter: Secondary | ICD-10-CM | POA: Diagnosis not present

## 2018-07-12 DIAGNOSIS — G8929 Other chronic pain: Secondary | ICD-10-CM | POA: Diagnosis not present

## 2018-07-12 DIAGNOSIS — M7642 Tibial collateral bursitis [Pellegrini-Stieda], left leg: Secondary | ICD-10-CM | POA: Diagnosis not present

## 2018-07-12 DIAGNOSIS — M25562 Pain in left knee: Secondary | ICD-10-CM | POA: Insufficient documentation

## 2018-07-12 DIAGNOSIS — M25462 Effusion, left knee: Secondary | ICD-10-CM | POA: Diagnosis not present

## 2018-07-14 DIAGNOSIS — N840 Polyp of corpus uteri: Secondary | ICD-10-CM | POA: Diagnosis not present

## 2018-07-14 DIAGNOSIS — N939 Abnormal uterine and vaginal bleeding, unspecified: Secondary | ICD-10-CM | POA: Diagnosis not present

## 2018-07-14 DIAGNOSIS — Z3202 Encounter for pregnancy test, result negative: Secondary | ICD-10-CM | POA: Diagnosis not present

## 2018-07-15 ENCOUNTER — Encounter: Payer: Self-pay | Admitting: Sports Medicine

## 2018-07-15 DIAGNOSIS — M1712 Unilateral primary osteoarthritis, left knee: Secondary | ICD-10-CM

## 2018-07-23 MED FILL — DICLOFENAC SODIUM 1% GEL: 1 | 6 days supply | Qty: 100 | Fill #1

## 2018-07-28 DIAGNOSIS — E669 Obesity, unspecified: Secondary | ICD-10-CM | POA: Diagnosis not present

## 2018-07-28 DIAGNOSIS — F438 Other reactions to severe stress: Secondary | ICD-10-CM | POA: Diagnosis not present

## 2018-07-28 DIAGNOSIS — M62838 Other muscle spasm: Secondary | ICD-10-CM | POA: Diagnosis not present

## 2018-07-28 DIAGNOSIS — J301 Allergic rhinitis due to pollen: Secondary | ICD-10-CM | POA: Diagnosis not present

## 2018-07-28 DIAGNOSIS — M25562 Pain in left knee: Secondary | ICD-10-CM | POA: Diagnosis not present

## 2018-07-28 DIAGNOSIS — J3089 Other allergic rhinitis: Secondary | ICD-10-CM | POA: Diagnosis not present

## 2018-07-28 DIAGNOSIS — J3081 Allergic rhinitis due to animal (cat) (dog) hair and dander: Secondary | ICD-10-CM | POA: Diagnosis not present

## 2018-08-05 DIAGNOSIS — J3081 Allergic rhinitis due to animal (cat) (dog) hair and dander: Secondary | ICD-10-CM | POA: Diagnosis not present

## 2018-08-05 DIAGNOSIS — J3089 Other allergic rhinitis: Secondary | ICD-10-CM | POA: Diagnosis not present

## 2018-08-05 DIAGNOSIS — J301 Allergic rhinitis due to pollen: Secondary | ICD-10-CM | POA: Diagnosis not present

## 2018-08-08 DIAGNOSIS — M25562 Pain in left knee: Secondary | ICD-10-CM | POA: Diagnosis not present

## 2018-08-14 DIAGNOSIS — J301 Allergic rhinitis due to pollen: Secondary | ICD-10-CM | POA: Diagnosis not present

## 2018-08-14 DIAGNOSIS — J3081 Allergic rhinitis due to animal (cat) (dog) hair and dander: Secondary | ICD-10-CM | POA: Diagnosis not present

## 2018-08-14 DIAGNOSIS — J3089 Other allergic rhinitis: Secondary | ICD-10-CM | POA: Diagnosis not present

## 2018-08-21 DIAGNOSIS — M1712 Unilateral primary osteoarthritis, left knee: Secondary | ICD-10-CM | POA: Diagnosis not present

## 2018-08-21 DIAGNOSIS — M25562 Pain in left knee: Secondary | ICD-10-CM | POA: Diagnosis not present

## 2018-08-21 DIAGNOSIS — M23204 Derangement of unspecified medial meniscus due to old tear or injury, left knee: Secondary | ICD-10-CM | POA: Diagnosis not present

## 2018-08-21 DIAGNOSIS — S8992XA Unspecified injury of left lower leg, initial encounter: Secondary | ICD-10-CM | POA: Insufficient documentation

## 2018-08-21 DIAGNOSIS — M25462 Effusion, left knee: Secondary | ICD-10-CM | POA: Diagnosis not present

## 2018-08-27 DIAGNOSIS — J3089 Other allergic rhinitis: Secondary | ICD-10-CM | POA: Diagnosis not present

## 2018-08-27 DIAGNOSIS — J301 Allergic rhinitis due to pollen: Secondary | ICD-10-CM | POA: Diagnosis not present

## 2018-08-27 DIAGNOSIS — J3081 Allergic rhinitis due to animal (cat) (dog) hair and dander: Secondary | ICD-10-CM | POA: Diagnosis not present

## 2018-09-03 DIAGNOSIS — J301 Allergic rhinitis due to pollen: Secondary | ICD-10-CM | POA: Diagnosis not present

## 2018-09-03 DIAGNOSIS — J3089 Other allergic rhinitis: Secondary | ICD-10-CM | POA: Diagnosis not present

## 2018-09-03 DIAGNOSIS — J3081 Allergic rhinitis due to animal (cat) (dog) hair and dander: Secondary | ICD-10-CM | POA: Diagnosis not present

## 2018-09-23 DIAGNOSIS — J3089 Other allergic rhinitis: Secondary | ICD-10-CM | POA: Diagnosis not present

## 2018-09-23 DIAGNOSIS — J301 Allergic rhinitis due to pollen: Secondary | ICD-10-CM | POA: Diagnosis not present

## 2018-09-23 DIAGNOSIS — J3081 Allergic rhinitis due to animal (cat) (dog) hair and dander: Secondary | ICD-10-CM | POA: Diagnosis not present

## 2018-09-29 DIAGNOSIS — J301 Allergic rhinitis due to pollen: Secondary | ICD-10-CM | POA: Diagnosis not present

## 2018-09-29 DIAGNOSIS — J3081 Allergic rhinitis due to animal (cat) (dog) hair and dander: Secondary | ICD-10-CM | POA: Diagnosis not present

## 2018-09-29 DIAGNOSIS — J3089 Other allergic rhinitis: Secondary | ICD-10-CM | POA: Diagnosis not present

## 2018-10-20 DIAGNOSIS — J3089 Other allergic rhinitis: Secondary | ICD-10-CM | POA: Diagnosis not present

## 2018-10-20 DIAGNOSIS — J3081 Allergic rhinitis due to animal (cat) (dog) hair and dander: Secondary | ICD-10-CM | POA: Diagnosis not present

## 2018-10-20 DIAGNOSIS — J301 Allergic rhinitis due to pollen: Secondary | ICD-10-CM | POA: Diagnosis not present

## 2018-10-21 DIAGNOSIS — J3081 Allergic rhinitis due to animal (cat) (dog) hair and dander: Secondary | ICD-10-CM | POA: Diagnosis not present

## 2018-10-21 DIAGNOSIS — N6012 Diffuse cystic mastopathy of left breast: Secondary | ICD-10-CM | POA: Diagnosis not present

## 2018-10-21 DIAGNOSIS — J3089 Other allergic rhinitis: Secondary | ICD-10-CM | POA: Diagnosis not present

## 2018-10-21 DIAGNOSIS — N6489 Other specified disorders of breast: Secondary | ICD-10-CM | POA: Diagnosis not present

## 2018-10-21 DIAGNOSIS — J301 Allergic rhinitis due to pollen: Secondary | ICD-10-CM | POA: Diagnosis not present

## 2018-10-29 DIAGNOSIS — M25562 Pain in left knee: Secondary | ICD-10-CM | POA: Diagnosis not present

## 2018-10-29 DIAGNOSIS — E669 Obesity, unspecified: Secondary | ICD-10-CM | POA: Diagnosis not present

## 2018-10-29 DIAGNOSIS — G8929 Other chronic pain: Secondary | ICD-10-CM | POA: Diagnosis not present

## 2018-10-29 DIAGNOSIS — R030 Elevated blood-pressure reading, without diagnosis of hypertension: Secondary | ICD-10-CM | POA: Diagnosis not present

## 2018-10-29 DIAGNOSIS — K5900 Constipation, unspecified: Secondary | ICD-10-CM | POA: Diagnosis not present

## 2018-10-30 DIAGNOSIS — J3089 Other allergic rhinitis: Secondary | ICD-10-CM | POA: Diagnosis not present

## 2018-10-30 DIAGNOSIS — J301 Allergic rhinitis due to pollen: Secondary | ICD-10-CM | POA: Diagnosis not present

## 2018-10-30 DIAGNOSIS — J3081 Allergic rhinitis due to animal (cat) (dog) hair and dander: Secondary | ICD-10-CM | POA: Diagnosis not present

## 2018-11-05 DIAGNOSIS — J3081 Allergic rhinitis due to animal (cat) (dog) hair and dander: Secondary | ICD-10-CM | POA: Diagnosis not present

## 2018-11-05 DIAGNOSIS — J301 Allergic rhinitis due to pollen: Secondary | ICD-10-CM | POA: Diagnosis not present

## 2018-11-05 DIAGNOSIS — N926 Irregular menstruation, unspecified: Secondary | ICD-10-CM | POA: Diagnosis not present

## 2018-11-05 DIAGNOSIS — N979 Female infertility, unspecified: Secondary | ICD-10-CM | POA: Diagnosis not present

## 2018-11-05 DIAGNOSIS — J3089 Other allergic rhinitis: Secondary | ICD-10-CM | POA: Diagnosis not present

## 2018-11-13 DIAGNOSIS — J3089 Other allergic rhinitis: Secondary | ICD-10-CM | POA: Diagnosis not present

## 2018-11-13 DIAGNOSIS — J3081 Allergic rhinitis due to animal (cat) (dog) hair and dander: Secondary | ICD-10-CM | POA: Diagnosis not present

## 2018-11-13 DIAGNOSIS — J301 Allergic rhinitis due to pollen: Secondary | ICD-10-CM | POA: Diagnosis not present

## 2019-01-09 MED FILL — MELOXICAM 15 MG TABLET: 15 | 30 days supply | Qty: 30 | Fill #0

## 2019-04-17 IMAGING — MR MR KNEE*L* W/O CM
5 of 6 series · 32 of 40 positions shown · non-contrast
Comparison: Radiographs from 05/22/2018

CLINICAL DATA: Medial and lateral left knee pain and swelling.

EXAM:
MRI OF THE LEFT KNEE WITHOUT CONTRAST
TECHNIQUE: Multiplanar, multisequence MR imaging of the knee was performed. No
intravenous contrast was administered.

[Series 3: T2 fat-sat · axial · 4.0mm · 0.66mm/px · z∈[-82,+38]mm · 6 of 25 slices shown (1 of 3)]
[im 1/25]
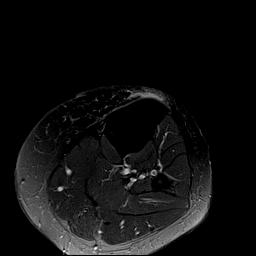
[im 5/25]
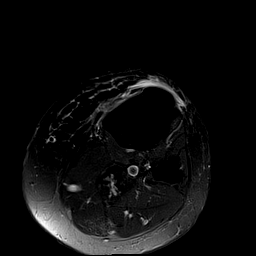
[im 10/25]
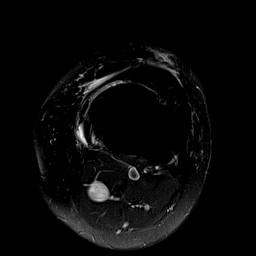
[im 15/25]
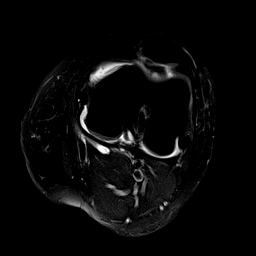
[im 20/25]
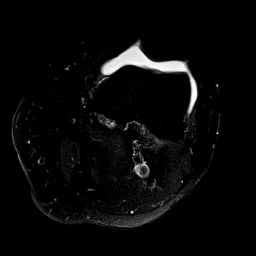
[im 25/25]
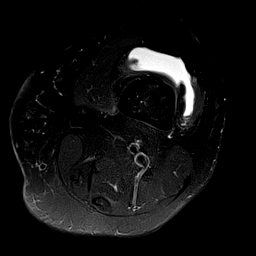

[Series 5: T2 fat-sat · coronal · 4.0mm · 0.62mm/px · 6 of 24 slices shown (2 of 3)]
[im 1/24]
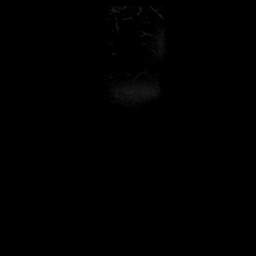
[im 5/24]
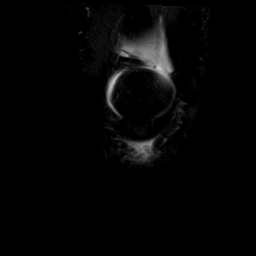
[im 10/24]
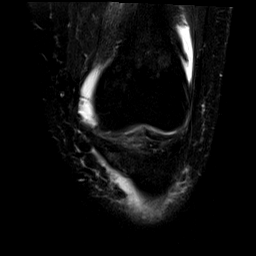
[im 14/24]
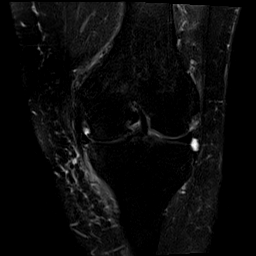
[im 19/24]
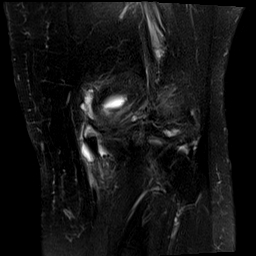
[im 24/24]
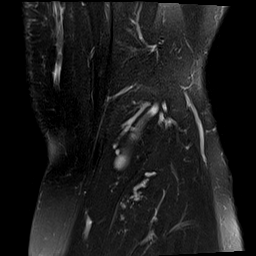

[Series 6: PD fat-sat · coronal · 3.0mm · 0.62mm/px · 8 of 30 slices shown (1 of 2)]
[im 1/30]
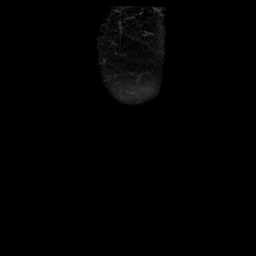
[im 5/30]
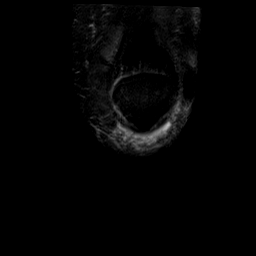
[im 9/30]
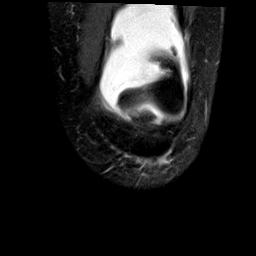
[im 13/30]
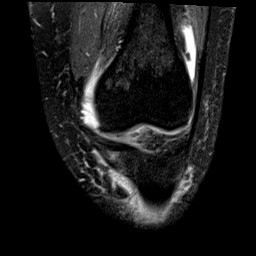
[im 17/30]
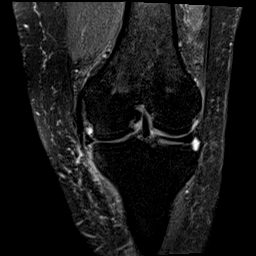
[im 21/30]
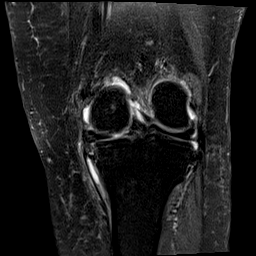
[im 25/30]
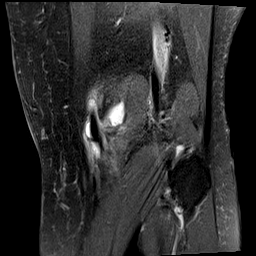
[im 30/30]
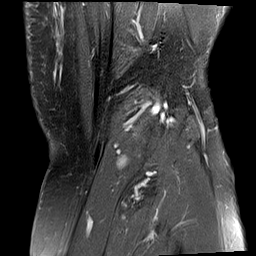

[Series 7: PD fat-sat · sagittal · 3.0mm · 0.31mm/px · 7 of 28 slices shown (2 of 2)]
[im 1/28]
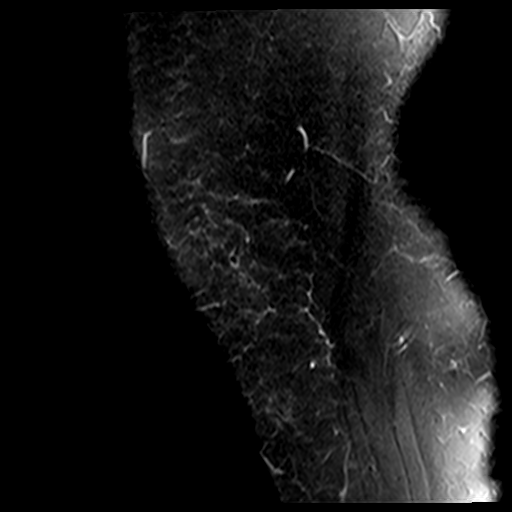
[im 5/28]
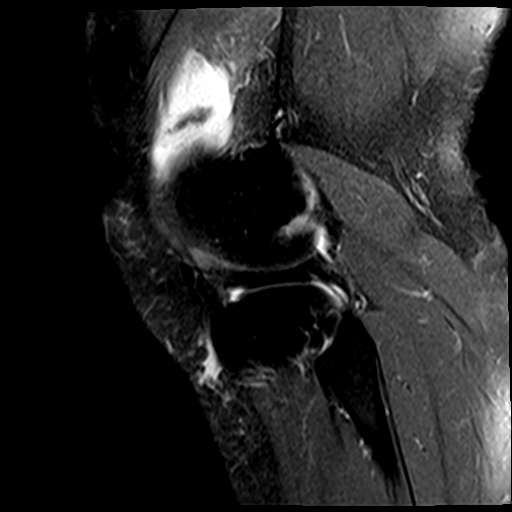
[im 10/28]
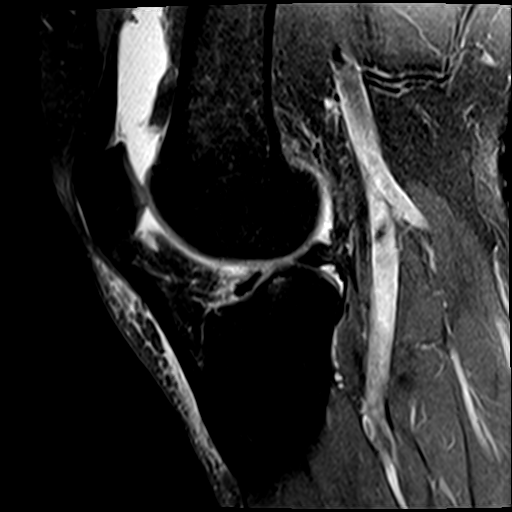
[im 14/28]
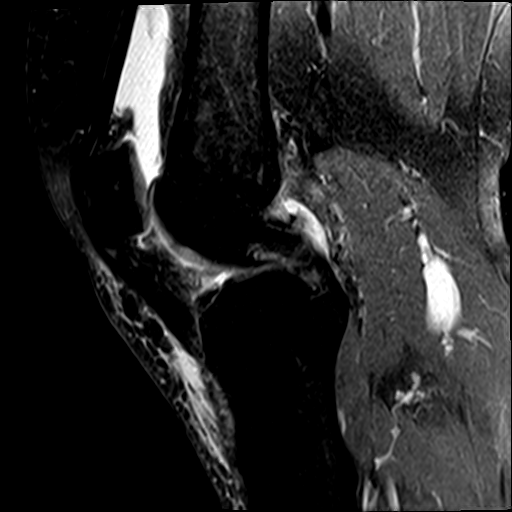
[im 19/28]
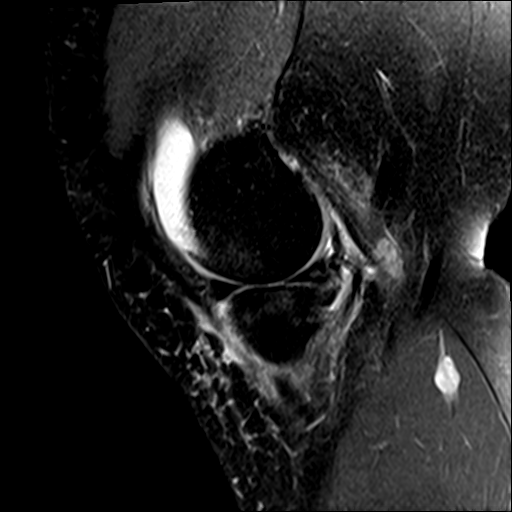
[im 23/28]
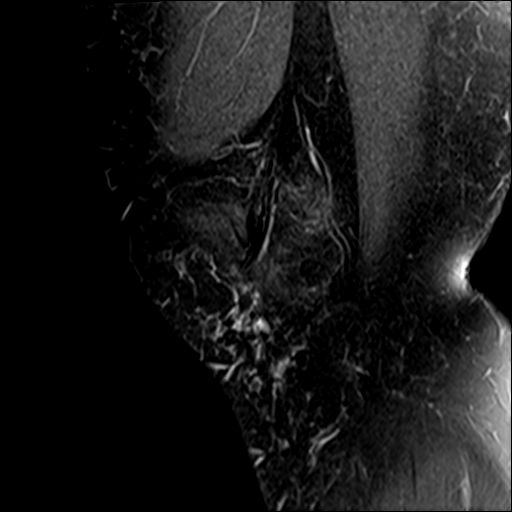
[im 28/28]
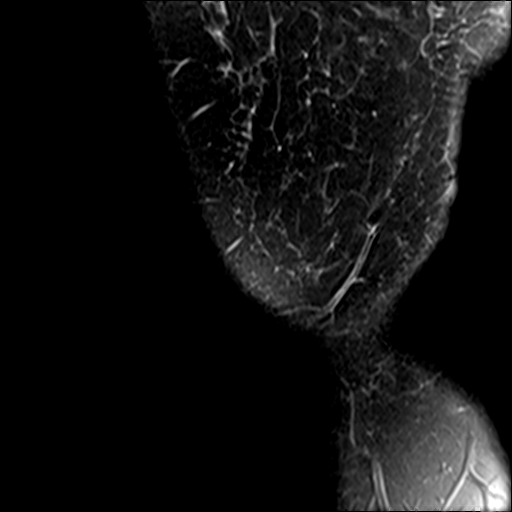

[Series 8: T2 fat-sat · sagittal · 3.0mm · 0.31mm/px · 5 of 28 slices shown (3 of 3)]
[im 1/28]
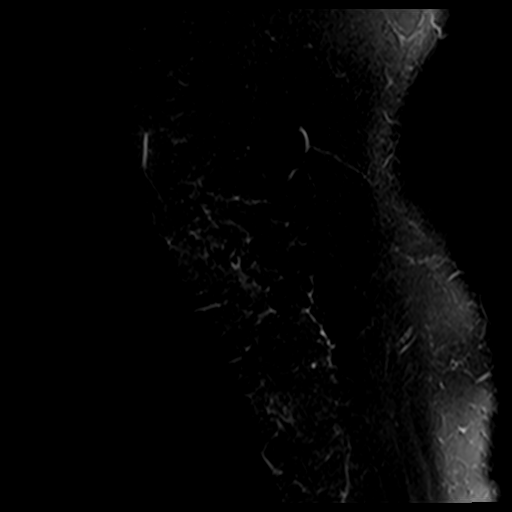
[im 5/28]
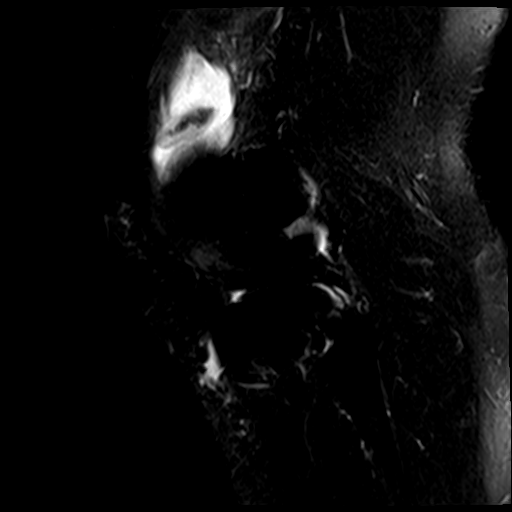
[im 10/28]
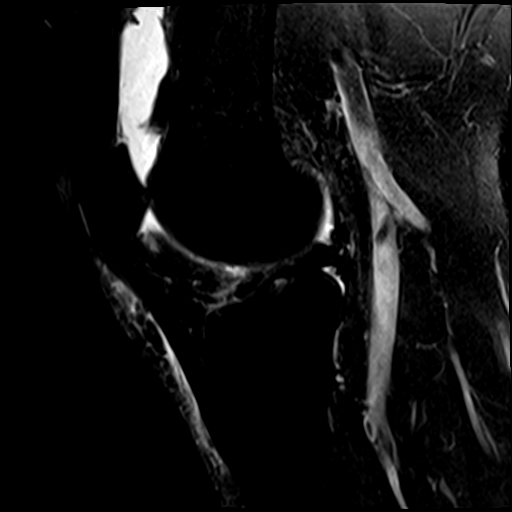
[im 14/28]
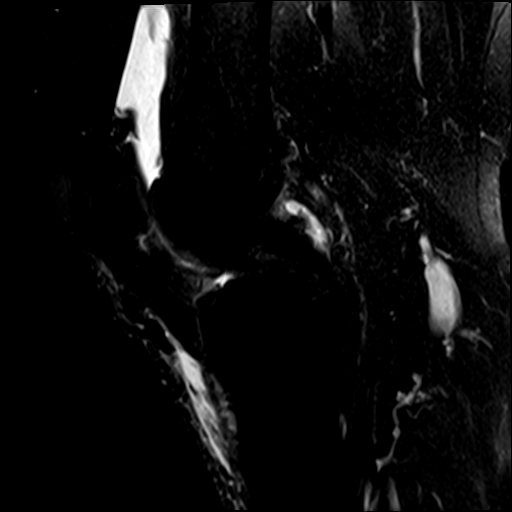
[im 19/28]
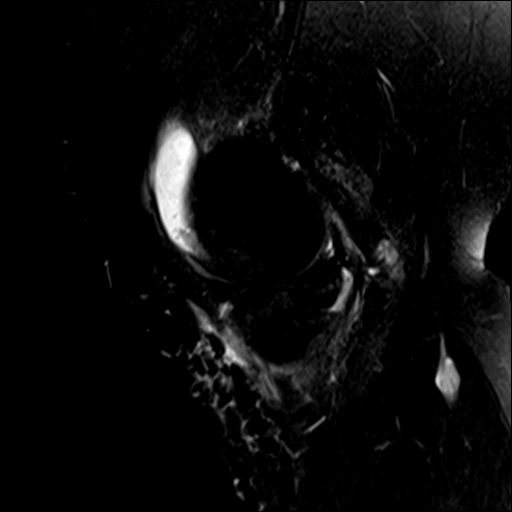

[32 of 40 positions shown; findings below may reference images not displayed]

FINDINGS: Despite efforts by the technologist and patient, motion artifact is
present on today's exam and could not be eliminated. This reduces
exam sensitivity and specificity.

MENISCI

Medial meniscus: There is a large radial tear of the posterior horn
medial meniscus near the meniscal root. Phantom meniscus sign on
image [DATE]. Adjacent grade 1 signal in the posterior horn noted.
Peripheral meniscal extrusion due to loss of articular space and
marginal spurring.

Lateral meniscus:  Unremarkable

LIGAMENTS

Cruciates:  Unremarkable

Collaterals: Mildly thickened MCL the especially proximally
suggesting a prior MCL injury. Fibular collateral ligament intact.

CARTILAGE

Patellofemoral:  Unremarkable

Medial: Prominent medial compartmental narrowing with marginal
spurring. Faint subcortical marrow edema along the medial tibial
plateau.

Lateral:  Unremarkable

Joint:  Large knee effusion.

Popliteal Fossa: Small Baker's cyst. There is also
semimembranosus-tibial collateral ligament bursitis.

Extensor Mechanism: Subcutaneous edema anterior to the patellar
tendon.

Bones: No significant extra-articular osseous abnormalities
identified.

Other: No supplemental non-categorized findings.
IMPRESSION: 1. Large radial tear of the posterior horn medial meniscus near the
meniscal root.
2. Prominent medial compartmental articular space narrowing with
marginal spurring causing meniscal extrusion.
3. Mildly thickened MCL proximally suggesting a remote MCL injury.
4. Large knee effusion with small Baker's cyst.
5. Semimembranosus-tibial collateral ligament bursitis.

## 2019-05-07 ENCOUNTER — Encounter: Payer: Self-pay | Admitting: Podiatry

## 2019-05-07 ENCOUNTER — Ambulatory Visit: Payer: BC Managed Care – PPO | Admitting: Podiatry

## 2019-05-07 ENCOUNTER — Ambulatory Visit (INDEPENDENT_AMBULATORY_CARE_PROVIDER_SITE_OTHER): Payer: BC Managed Care – PPO

## 2019-05-07 ENCOUNTER — Other Ambulatory Visit: Payer: Self-pay | Admitting: Podiatry

## 2019-05-07 ENCOUNTER — Other Ambulatory Visit: Payer: Self-pay

## 2019-05-07 VITALS — BP 131/86 | HR 87 | Temp 98.0°F | Resp 16

## 2019-05-07 DIAGNOSIS — M7752 Other enthesopathy of left foot: Secondary | ICD-10-CM

## 2019-05-07 DIAGNOSIS — Q828 Other specified congenital malformations of skin: Secondary | ICD-10-CM | POA: Diagnosis not present

## 2019-05-07 DIAGNOSIS — M779 Enthesopathy, unspecified: Secondary | ICD-10-CM

## 2019-05-07 DIAGNOSIS — M778 Other enthesopathies, not elsewhere classified: Secondary | ICD-10-CM

## 2019-05-07 DIAGNOSIS — T8484XA Pain due to internal orthopedic prosthetic devices, implants and grafts, initial encounter: Secondary | ICD-10-CM

## 2019-05-07 NOTE — Progress Notes (Signed)
Subjective:  Patient ID: Ann Gordon, female    DOB: [birth year],  MRN: [id number] HPI Chief Complaint  Patient presents with  . Foot Pain    Lateral 5th MPJ left - callused area x 6 weeks, gotten thicker and more painful with shoes and walking, tried trimming  . Ankle Pain    Concerned about the swelling in ankle left - previous surgery - screw fixation and worried about irritation  . New Patient (Initial Visit)    43 y.o. female presents with the above complaint.   ROS: Denies fever chills nausea vomiting muscle aches pains calf pain back pain chest pain shortness of breath.  Past Medical History:  Diagnosis Date  . Seasonal allergies    No past surgical history on file.  Current Outpatient Medications:  .  phentermine 37.5 MG capsule, Take by mouth., Disp: , Rfl:  .  azelastine (ASTELIN) 0.1 % nasal spray, USE 1 (ONE) SPRAY EACH NOSTRIL TWO TIMES DAILY, Disp: , Rfl:  .  buPROPion (WELLBUTRIN XL) 150 MG 24 hr tablet, TITRATE TO 3 TABLETS IN THE MORNING AS DIRECTED, Disp: , Rfl:  .  cyclobenzaprine (FLEXERIL) 10 MG tablet, Take 1 tablet (10 mg total) by mouth 2 (two) times daily as needed., Disp: 20 tablet, Rfl: 0 .  diclofenac (VOLTAREN) 75 MG EC tablet, Take 1 tablet (75 mg total) by mouth 2 (two) times daily., Disp: 60 tablet, Rfl: 3 .  diclofenac sodium (VOLTAREN) 1 % GEL, Apply 4 g topically 4 (four) times daily. To affected joint., Disp: 100 g, Rfl: 11 .  fluticasone (FLONASE) 50 MCG/ACT nasal spray, , Disp: , Rfl:  .  hydrochlorothiazide (HYDRODIURIL) 12.5 MG tablet, TAKE ONE TABLET (12.5 MG DOSE) BY MOUTH EVERY MORNING., Disp: , Rfl:  .  HYDROcodone-acetaminophen (NORCO/VICODIN) 5-325 MG tablet, Take 1 tablet by mouth every 4 (four) hours as needed., Disp: 10 tablet, Rfl: 0 .  letrozole (FEMARA) 2.5 MG tablet, , Disp: , Rfl:  .  levocetirizine (XYZAL) 5 MG tablet, Take 5 mg by mouth every evening., Disp: , Rfl:  .  meloxicam (MOBIC) 15 MG tablet, , Disp: , Rfl:  .   Multiple Vitamin (MULTIVITAMIN) tablet, Take 1 tablet by mouth daily., Disp: , Rfl:  .  propranolol (INDERAL) 10 MG tablet, TAKE 1 TABLET EVERY 8 HOURS IF NEEDED FOR PALPITATION OR ELEVATION IN BLOOD PRESSURE DUE TO STRESS, Disp: , Rfl:  .  traMADol (ULTRAM) 50 MG tablet, Take 1 tablet (50 mg total) by mouth every 8 (eight) hours as needed for moderate pain., Disp: 15 tablet, Rfl: 0  Allergies  Allergen Reactions  . Cashew Nut Oil Anaphylaxis and Itching  . Other    Review of Systems Objective:   Vitals:   05/07/19 1622  BP: 131/86  Pulse: 87  Resp: 16  Temp: 98 F (36.7 C)    General: Well developed, nourished, in no acute distress, alert and oriented x3   Dermatological: Skin is warm, dry and supple bilateral. Nails x 10 are well maintained; remaining integument appears unremarkable at this time. There are no open sores, no preulcerative lesions, no rash or signs of infection present.  Porokeratotic lesion lateral aspect of the fifth metatarsal head left.  Vascular: Dorsalis Pedis artery and Posterior Tibial artery pedal pulses are 2/4 bilateral with immedate capillary fill time. Pedal hair growth present. No varicosities and no lower extremity edema present bilateral.   Neruologic: Grossly intact via light touch bilateral. Vibratory intact via tuning fork bilateral. Protective threshold  with Semmes Wienstein monofilament intact to all pedal sites bilateral. Patellar and Achilles deep tendon reflexes 2+ bilateral. No Babinski or clonus noted bilateral.   Musculoskeletal: No gross boney pedal deformities bilateral. No pain, crepitus, or limitation noted with foot and ankle range of motion bilateral. Muscular strength 5/5 in all groups tested bilateral.  Bursitis to the lateral aspect of the fifth metatarsal head left.  There is fluctuance mild edema mild erythema no cellulitis drainage or odor.  Gait: Unassisted, Nonantalgic.    Radiographs:  Radiographs taken today demonstrate  an arthrodesis screw to the subtalar joint of the left foot.  No other findings are noted.  Assessment & Plan:   Assessment: Bursitis and porokeratosis lateral aspect of the fifth metatarsal left foot.  Plan: We discussed etiology pathology conservative versus surgical therapies which discussed appropriate shoe gear.  I injected the bursitis today with 2 mg of dexamethasone and local anesthetic and debrided sharply the porokeratotic lesion placing antibiotic ointment and a Band-Aid.  This alleviated her symptoms.  I will follow-up with her as needed.     Max T. [place]qu[place], [place]

## 2019-05-21 ENCOUNTER — Ambulatory Visit: Payer: 59 | Admitting: Podiatry

## 2019-06-18 ENCOUNTER — Encounter: Payer: Self-pay | Admitting: Podiatry

## 2019-06-18 ENCOUNTER — Ambulatory Visit: Payer: BC Managed Care – PPO | Admitting: Podiatry

## 2019-06-18 ENCOUNTER — Other Ambulatory Visit: Payer: Self-pay

## 2019-06-18 VITALS — Temp 98.2°F

## 2019-06-18 DIAGNOSIS — Q828 Other specified congenital malformations of skin: Secondary | ICD-10-CM

## 2019-06-18 DIAGNOSIS — M7752 Other enthesopathy of left foot: Secondary | ICD-10-CM

## 2019-06-18 NOTE — Progress Notes (Signed)
She presents today for follow-up of callus to the fifth metatarsal of the left foot.  She states that it came back to 3 days after we trimmed it last time.  She states that she has not purchased new shoes as of yet.  Objective: Vital signs are stable alert oriented x3 pulses are palpable.  Mild tailor's bunion deformity with reactive hyperkeratotic lesion lateral aspect of the fifth met area which is exquisitely painful.  Assessment: Tailor's bunion deformity reactive hyperkeratotic lesion associated with poor fitting shoe gear.  Plan: Discussed appropriate shoe gear today and debrided all reactive hyperkeratotic tissue follow-up with her in 1 month or as an as-needed basis.

## 2020-01-04 ENCOUNTER — Ambulatory Visit: Payer: 59 | Admitting: Sports Medicine

## 2020-01-08 ENCOUNTER — Ambulatory Visit: Payer: 59 | Admitting: Sports Medicine

## 2020-01-15 ENCOUNTER — Ambulatory Visit: Payer: BC Managed Care – PPO | Admitting: Sports Medicine

## 2020-02-26 ENCOUNTER — Ambulatory Visit (INDEPENDENT_AMBULATORY_CARE_PROVIDER_SITE_OTHER): Payer: No Typology Code available for payment source | Admitting: Sports Medicine

## 2020-02-26 ENCOUNTER — Telehealth: Payer: Self-pay | Admitting: Sports Medicine

## 2020-02-26 ENCOUNTER — Encounter: Payer: Self-pay | Admitting: Sports Medicine

## 2020-02-26 ENCOUNTER — Other Ambulatory Visit: Payer: Self-pay | Admitting: Sports Medicine

## 2020-02-26 ENCOUNTER — Other Ambulatory Visit: Payer: Self-pay

## 2020-02-26 VITALS — BP 134/83 | HR 93 | Ht 63.0 in | Wt 191.0 lb

## 2020-02-26 DIAGNOSIS — M21622 Bunionette of left foot: Secondary | ICD-10-CM | POA: Diagnosis not present

## 2020-02-26 DIAGNOSIS — M170 Bilateral primary osteoarthritis of knee: Secondary | ICD-10-CM | POA: Diagnosis not present

## 2020-02-26 DIAGNOSIS — R8290 Unspecified abnormal findings in urine: Secondary | ICD-10-CM

## 2020-02-26 DIAGNOSIS — R300 Dysuria: Secondary | ICD-10-CM | POA: Diagnosis not present

## 2020-02-26 LAB — POCT URINALYSIS DIP (CLINITEK)
Bilirubin, UA: NEGATIVE
Glucose, UA: NEGATIVE mg/dL
Ketones, POC UA: NEGATIVE mg/dL
Leukocytes, UA: NEGATIVE
Nitrite, UA: NEGATIVE
POC PROTEIN,UA: NEGATIVE
Spec Grav, UA: >=1.03 — AB (ref 1.010–1.025)
Urobilinogen, UA: 0.2 E.U./dL
pH, UA: 6.5 (ref 5.0–8.0)

## 2020-02-26 MED ORDER — CEPHALEXIN 500 MG PO CAPS: 500.0000 mg | ORAL_CAPSULE | Freq: Two times a day (BID) | ORAL | 0 refills | Status: DC

## 2020-02-26 MED ORDER — FLUCONAZOLE 150 MG PO TABS: 150.0000 mg | ORAL_TABLET | Freq: Once | ORAL | 3 refills | Status: DC

## 2020-02-26 NOTE — Assessment & Plan Note (Signed)
This pleasant 44 year old female has known bilateral knee osteoarthritis, she is taking NSAIDs, she is had aspiration and injection. She is interested in viscosupplementation, she has x-ray confirmed osteoarthritis with x-rays recently taken at an outside facility. We are going to get her approved and then she can follow-up for injections when approved.

## 2020-02-26 NOTE — Addendum Note (Signed)
Addended by: Beatris Ship L on: 02/26/2020 04:29 PM   Modules accepted: Orders

## 2020-02-26 NOTE — Telephone Encounter (Signed)
This young lady is interested in Orthovisc approval for both knees, x-ray confirmed osteoarthritis, failed steroid injections, NSAIDs.  Please call to schedule when approved.  Okay to switch to other preferred agent if necessary.

## 2020-02-26 NOTE — Assessment & Plan Note (Signed)
She also has a small bunionette on her left foot. Significant pain, I think some of the pain is coming from pressure on the overlying skin and abnormal callus. I would like podiatry downstairs to weigh in.

## 2020-02-26 NOTE — Assessment & Plan Note (Signed)
She also had a UTI back in February. She does not feel as though her symptoms fully resolved. Burning, frequency. Abnormal odor. Urinalysis today only shows positive blood. I would like to culture the urine, adding Keflex, Diflucan. She can follow this up with a PCP though she does need a repeat urinalysis to ensure clearance of the blood.

## 2020-02-26 NOTE — Progress Notes (Signed)
    Procedures performed today:    Urinalysis positive for blood.  Independent interpretation of notes and tests performed by another provider:   None.  Brief History, Exam, Impression, and Recommendations:    Primary osteoarthritis of both knees This pleasant 44 year old female has known bilateral knee osteoarthritis, she is taking NSAIDs, she is had aspiration and injection. She is interested in viscosupplementation, she has x-ray confirmed osteoarthritis with x-rays recently taken at an outside facility. We are going to get her approved and then she can follow-up for injections when approved.  Bunionette of left foot She also has a small bunionette on her left foot. Significant pain, I think some of the pain is coming from pressure on the overlying skin and abnormal callus. I would like podiatry downstairs to weigh in.  Dysuria She also had a UTI back in February. She does not feel as though her symptoms fully resolved. Burning, frequency. Abnormal odor. Urinalysis today only shows positive blood. I would like to culture the urine, adding Keflex, Diflucan. She can follow this up with a PCP though she does need a repeat urinalysis to ensure clearance of the blood.    ___________________________________________ Gwen Her. Dianah Field, M.D., ABFM., CAQSM. Primary Care and [place] Instructor of [place] of [facility] of Medicine

## 2020-02-28 LAB — URINE CULTURE
MICRO NUMBER:: 10346572
SPECIMEN QUALITY:: ADEQUATE

## 2020-03-03 DIAGNOSIS — M21622 Bunionette of left foot: Secondary | ICD-10-CM

## 2020-03-03 NOTE — Telephone Encounter (Signed)
Received Benefits summary detail and I will have to call [facility] patient has cone Focus plan Due to 3rd Party I have to get her benefits and pharmacy details. - CF

## 2020-03-03 NOTE — Telephone Encounter (Signed)
Started Orthovisc case waiting on benefits investigation detail - CF

## 2020-03-03 NOTE — Telephone Encounter (Signed)
Routing to provider  

## 2020-03-14 ENCOUNTER — Other Ambulatory Visit: Payer: Self-pay | Admitting: Family Medicine

## 2020-03-14 ENCOUNTER — Ambulatory Visit (INDEPENDENT_AMBULATORY_CARE_PROVIDER_SITE_OTHER): Payer: No Typology Code available for payment source | Admitting: Family Medicine

## 2020-03-14 ENCOUNTER — Encounter: Payer: Self-pay | Admitting: Family Medicine

## 2020-03-14 ENCOUNTER — Other Ambulatory Visit: Payer: Self-pay

## 2020-03-14 VITALS — BP 135/78 | HR 76 | Ht 63.0 in | Wt 195.0 lb

## 2020-03-14 DIAGNOSIS — I10 Essential (primary) hypertension: Secondary | ICD-10-CM | POA: Insufficient documentation

## 2020-03-14 DIAGNOSIS — N939 Abnormal uterine and vaginal bleeding, unspecified: Secondary | ICD-10-CM | POA: Insufficient documentation

## 2020-03-14 DIAGNOSIS — Z124 Encounter for screening for malignant neoplasm of cervix: Secondary | ICD-10-CM

## 2020-03-14 DIAGNOSIS — F411 Generalized anxiety disorder: Secondary | ICD-10-CM

## 2020-03-14 DIAGNOSIS — R8290 Unspecified abnormal findings in urine: Secondary | ICD-10-CM

## 2020-03-14 DIAGNOSIS — R3129 Other microscopic hematuria: Secondary | ICD-10-CM | POA: Diagnosis not present

## 2020-03-14 MED ORDER — HYDROCHLOROTHIAZIDE 12.5 MG PO TABS: ORAL_TABLET | ORAL | 2 refills | Status: DC

## 2020-03-14 MED ORDER — METOPROLOL SUCCINATE ER 25 MG PO TB24: 25.0000 mg | ORAL_TABLET | Freq: Every day | ORAL | 2 refills | Status: DC

## 2020-03-14 MED ORDER — LEVOCETIRIZINE DIHYDROCHLORIDE 5 MG PO TABS: 5.0000 mg | ORAL_TABLET | Freq: Every evening | ORAL | 2 refills | Status: DC

## 2020-03-14 MED ORDER — BUPROPION HCL ER (XL) 150 MG PO TB24: ORAL_TABLET | ORAL | 3 refills | Status: AC

## 2020-03-14 MED ORDER — BUPROPION HCL ER (XL) 150 MG PO TB24: ORAL_TABLET | ORAL | 3 refills | Status: DC

## 2020-03-14 MED FILL — HYDROCHLOROTHIAZIDE 12.5 MG: 12.5 | 90 days supply | Qty: 90 | Fill #0

## 2020-03-14 MED FILL — LEVOCETIRIZINE 5 MG TABLET: 5 | 90 days supply | Qty: 90 | Fill #0

## 2020-03-14 MED FILL — buPROPion HCL ER (XL) 150 M: 150 | 90 days supply | Qty: 90 | Fill #0

## 2020-03-14 MED FILL — METOPROLOL SUCCINATE ER 25: 25 | 90 days supply | Qty: 90 | Fill #0

## 2020-03-14 NOTE — Progress Notes (Signed)
Ann Gordon - 44 y.o. female MRN [id number]  Date of birth: [birth year]  Subjective Chief Complaint  Patient presents with  . Establish Care    HPI Ann Gordon is a 44 y.o. female with history of HTN, GAD, and seasonal allergies here today for initial visit.  She had recent visit with Dr. Darene Lamer and had UTI symptoms.  UA with blood and she was treated for UTI.  Reports that symptoms have resolved but she needs repeat UA.  Reports at time of previous UA she was having some menstrual spotting   HTN:  Managed with HCTZ and metoprolol.  She is doing well with this and denies any symptoms including chest pain, shortness of breath, palpitations, headache or vision changes.   GAD: Current treatment with bupropion.  Doing well with this. Denies side effects.    She is also requesting referral to GYN for Paps and irregular menstrual cycle.    ROS:  A comprehensive ROS was completed and negative except as noted per HPI   Allergies  Allergen Reactions  . Cashew Nut Oil Anaphylaxis and Itching  . Other Anaphylaxis    Tree nuts    Past Medical History:  Diagnosis Date  . Seasonal allergies     No past surgical history on file.  Social History   Socioeconomic History  . Marital status: Legally Separated    Spouse name: Not on file  . Number of children: Not on file  . Years of education: Not on file  . Highest education level: Not on file  Occupational History  . Not on file  Tobacco Use  . Smoking status: Former Research scientist (life sciences)  . Smokeless tobacco: Never Used  Substance and Sexual Activity  . Alcohol use: Yes    Alcohol/week: 2.0 - 3.0 standard drinks    Types: 2 - 3 Cans of beer per week  . Drug use: Never  . Sexual activity: Not Currently  Other Topics Concern  . Not on file  Social History Narrative  . Not on file   Social Determinants of Health   Financial Resource Strain:   . Difficulty of Paying Living Expenses:   Food Insecurity:   . Worried About Charity fundraiser  in the Last Year:   . Arboriculturist in the Last Year:   Transportation Needs:   . Film/video editor (Medical):   Marland Kitchen Lack of Transportation (Non-Medical):   Physical Activity:   . Days of Exercise per Week:   . Minutes of Exercise per Session:   Stress:   . Feeling of Stress :   Social Connections:   . Frequency of Communication with Friends and Family:   . Frequency of Social Gatherings with Friends and Family:   . Attends Religious Services:   . Active Member of Clubs or Organizations:   . Attends Archivist Meetings:   Marland Kitchen Marital Status:     Family History  Problem Relation Age of Onset  . Hypertension Mother   . Hypertension Father   . Diabetes Father     Health Maintenance  Topic Date Due  . HIV Screening  Never done  . PAP SMEAR-Modifier  Never done  . INFLUENZA VACCINE  06/19/2020  . TETANUS/TDAP  09/22/2025  . COVID-19 Vaccine  Completed     ----------------------------------------------------------------------------------------------------------------------------------------------------------------------------------------------------------------- Physical Exam BP 135/78   Pulse 76   Ht 5\' 3"  (1.6 m)   Wt 195 lb (88.5 kg)   BMI 34.54 kg/m   Physical Exam  Constitutional:      Appearance: Normal appearance.  HENT:     Head: Normocephalic and atraumatic.  Eyes:     General: No scleral icterus. Cardiovascular:     Rate and Rhythm: Normal rate and regular rhythm.  Pulmonary:     Effort: Pulmonary effort is normal.     Breath sounds: Normal breath sounds.  Musculoskeletal:     Cervical back: Neck supple.  Neurological:     General: No focal deficit present.     Mental Status: She is alert.  Psychiatric:        Mood and Affect: Mood normal.        Behavior: Behavior normal.      ------------------------------------------------------------------------------------------------------------------------------------------------------------------------------------------------------------------- Assessment and Plan  Essential hypertension Blood pressure is at goal at for age and co-morbidities.  I recommend she continue current medications.  In addition they were instructed to follow a low sodium diet with regular exercise to help to maintain adequate control of blood pressure.    Abnormal uterine bleeding Referral placed to Dr. Garwin Brothers per patient request for AUB and cervical cancer screening  Microscopic hematuria Still with small amount of urine.   Will refer to urology for further evaluation.    GAD (generalized anxiety disorder) Stable with bupropion, continue.    Meds ordered this encounter  Medications  . DISCONTD: buPROPion (WELLBUTRIN XL) 150 MG 24 hr tablet    Sig: Take 1 tab daily.    Dispense:  90 tablet    Refill:  3  . DISCONTD: hydrochlorothiazide (HYDRODIURIL) 12.5 MG tablet    Sig: TAKE ONE TABLET (12.5 MG DOSE) BY MOUTH EVERY MORNING.    Dispense:  90 tablet    Refill:  2  . DISCONTD: levocetirizine (XYZAL) 5 MG tablet    Sig: Take 1 tablet (5 mg total) by mouth every evening.    Dispense:  90 tablet    Refill:  2  . DISCONTD: metoprolol succinate (TOPROL-XL) 25 MG 24 hr tablet    Sig: Take 1 tablet (25 mg total) by mouth daily.    Dispense:  90 tablet    Refill:  2  . buPROPion (WELLBUTRIN XL) 150 MG 24 hr tablet    Sig: Take 1 tab daily.    Dispense:  90 tablet    Refill:  3  . hydrochlorothiazide (HYDRODIURIL) 12.5 MG tablet    Sig: TAKE ONE TABLET (12.5 MG DOSE) BY MOUTH EVERY MORNING.    Dispense:  90 tablet    Refill:  2  . metoprolol succinate (TOPROL-XL) 25 MG 24 hr tablet    Sig: Take 1 tablet (25 mg total) by mouth daily.    Dispense:  90 tablet    Refill:  2  . levocetirizine (XYZAL) 5 MG tablet    Sig: Take 1  tablet (5 mg total) by mouth every evening.    Dispense:  90 tablet    Refill:  2    No follow-ups on file.    This visit occurred during the SARS-CoV-2 public health emergency.  Safety protocols were in place, including screening questions prior to the visit, additional usage of staff PPE, and extensive cleaning of exam room while observing appropriate contact time as indicated for disinfecting solutions.

## 2020-03-14 NOTE — Assessment & Plan Note (Signed)
Referral placed to Dr. Garwin Brothers per patient request for AUB and cervical cancer screening

## 2020-03-14 NOTE — Assessment & Plan Note (Signed)
Stable with bupropion, continue.

## 2020-03-14 NOTE — Assessment & Plan Note (Signed)
Still with small amount of urine.   Will refer to urology for further evaluation.

## 2020-03-14 NOTE — Assessment & Plan Note (Signed)
Blood pressure is at goal at for age and co-morbidities.  I recommend she continue current medications.  In addition they were instructed to follow a low sodium diet with regular exercise to help to maintain adequate control of blood pressure.   

## 2020-03-14 NOTE — Patient Instructions (Addendum)
Great to meet you today! The blood in your urine is is still present, I would recommend that you see urology.   I have entered referral for Gyn as well.

## 2020-03-15 LAB — POCT URINALYSIS DIP (CLINITEK)
Bilirubin, UA: NEGATIVE
Glucose, UA: NEGATIVE mg/dL
Ketones, POC UA: NEGATIVE mg/dL
Leukocytes, UA: NEGATIVE
Nitrite, UA: NEGATIVE
POC PROTEIN,UA: NEGATIVE
Spec Grav, UA: 1.025 (ref 1.010–1.025)
Urobilinogen, UA: 0.2 E.U./dL
pH, UA: 7 (ref 5.0–8.0)

## 2020-03-16 ENCOUNTER — Other Ambulatory Visit (HOSPITAL_COMMUNITY): Payer: Self-pay | Admitting: Orthopedic Surgery

## 2020-03-28 ENCOUNTER — Ambulatory Visit: Payer: No Typology Code available for payment source | Admitting: Family Medicine

## 2020-04-11 ENCOUNTER — Encounter: Payer: Self-pay | Admitting: Family Medicine

## 2020-04-12 ENCOUNTER — Other Ambulatory Visit: Payer: Self-pay | Admitting: Family Medicine

## 2020-04-12 DIAGNOSIS — M21612 Bunion of left foot: Secondary | ICD-10-CM

## 2020-04-12 NOTE — Progress Notes (Signed)
e

## 2020-04-27 ENCOUNTER — Other Ambulatory Visit: Payer: Self-pay

## 2020-04-27 ENCOUNTER — Encounter (HOSPITAL_BASED_OUTPATIENT_CLINIC_OR_DEPARTMENT_OTHER): Payer: Self-pay | Admitting: Orthopedic Surgery

## 2020-05-03 ENCOUNTER — Other Ambulatory Visit (HOSPITAL_COMMUNITY)
Admission: RE | Admit: 2020-05-03 | Discharge: 2020-05-03 | Disposition: A | Discharge status: Home / Self Care | Payer: No Typology Code available for payment source | Source: Ambulatory Visit | Attending: Orthopedic Surgery | Admitting: Orthopedic Surgery

## 2020-05-03 DIAGNOSIS — Z01812 Encounter for preprocedural laboratory examination: Secondary | ICD-10-CM | POA: Insufficient documentation

## 2020-05-03 DIAGNOSIS — Z20822 Contact with and (suspected) exposure to covid-19: Secondary | ICD-10-CM | POA: Insufficient documentation

## 2020-05-04 ENCOUNTER — Encounter (HOSPITAL_BASED_OUTPATIENT_CLINIC_OR_DEPARTMENT_OTHER)
Admission: RE | Admit: 2020-05-04 | Discharge: 2020-05-04 | Disposition: A | Discharge status: Home / Self Care | Payer: No Typology Code available for payment source | Source: Ambulatory Visit | Attending: Orthopedic Surgery | Admitting: Orthopedic Surgery

## 2020-05-04 DIAGNOSIS — I10 Essential (primary) hypertension: Secondary | ICD-10-CM | POA: Diagnosis not present

## 2020-05-04 DIAGNOSIS — Z79899 Other long term (current) drug therapy: Secondary | ICD-10-CM | POA: Diagnosis not present

## 2020-05-04 DIAGNOSIS — M21622 Bunionette of left foot: Secondary | ICD-10-CM | POA: Diagnosis present

## 2020-05-04 DIAGNOSIS — Z87891 Personal history of nicotine dependence: Secondary | ICD-10-CM | POA: Diagnosis not present

## 2020-05-04 DIAGNOSIS — J302 Other seasonal allergic rhinitis: Secondary | ICD-10-CM | POA: Diagnosis not present

## 2020-05-04 DIAGNOSIS — Z01810 Encounter for preprocedural cardiovascular examination: Secondary | ICD-10-CM | POA: Insufficient documentation

## 2020-05-04 DIAGNOSIS — F419 Anxiety disorder, unspecified: Secondary | ICD-10-CM | POA: Diagnosis not present

## 2020-05-04 DIAGNOSIS — F329 Major depressive disorder, single episode, unspecified: Secondary | ICD-10-CM | POA: Diagnosis not present

## 2020-05-04 LAB — BASIC METABOLIC PANEL
Anion gap: 9 (ref 5–15)
BUN: 7 mg/dL (ref 6–20)
CO2: 25 mmol/L (ref 22–32)
Calcium: 9.7 mg/dL (ref 8.9–10.3)
Chloride: 104 mmol/L (ref 98–111)
Creatinine, Ser: 0.7 mg/dL (ref 0.44–1.00)
GFR calc Af Amer: >60 mL/min (ref >60)
GFR calc non Af Amer: >60 mL/min (ref >60)
Glucose, Bld: 98 mg/dL (ref 70–99)
Potassium: 4.3 mmol/L (ref 3.5–5.1)
Sodium: 138 mmol/L (ref 135–145)

## 2020-05-04 LAB — SARS CORONAVIRUS 2 (TAT 6-24 HRS): SARS Coronavirus 2: NEGATIVE

## 2020-05-04 NOTE — Progress Notes (Signed)

## 2020-05-05 ENCOUNTER — Ambulatory Visit (HOSPITAL_BASED_OUTPATIENT_CLINIC_OR_DEPARTMENT_OTHER): Payer: No Typology Code available for payment source | Admitting: Anesthesiology

## 2020-05-05 ENCOUNTER — Other Ambulatory Visit: Payer: Self-pay

## 2020-05-05 ENCOUNTER — Encounter (HOSPITAL_BASED_OUTPATIENT_CLINIC_OR_DEPARTMENT_OTHER): Payer: Self-pay | Admitting: Orthopedic Surgery

## 2020-05-05 ENCOUNTER — Encounter (HOSPITAL_BASED_OUTPATIENT_CLINIC_OR_DEPARTMENT_OTHER)
Admission: RE | Disposition: A | Discharge status: Home / Self Care | Payer: Self-pay | Source: Home / Self Care | Attending: Orthopedic Surgery

## 2020-05-05 ENCOUNTER — Ambulatory Visit (HOSPITAL_BASED_OUTPATIENT_CLINIC_OR_DEPARTMENT_OTHER)
Admission: RE | Admit: 2020-05-05 | Discharge: 2020-05-05 | Disposition: A | Discharge status: Home / Self Care | Payer: No Typology Code available for payment source | Attending: Orthopedic Surgery | Admitting: Orthopedic Surgery

## 2020-05-05 DIAGNOSIS — Z79899 Other long term (current) drug therapy: Secondary | ICD-10-CM | POA: Insufficient documentation

## 2020-05-05 DIAGNOSIS — M21622 Bunionette of left foot: Secondary | ICD-10-CM | POA: Insufficient documentation

## 2020-05-05 DIAGNOSIS — J302 Other seasonal allergic rhinitis: Secondary | ICD-10-CM | POA: Insufficient documentation

## 2020-05-05 DIAGNOSIS — F419 Anxiety disorder, unspecified: Secondary | ICD-10-CM | POA: Insufficient documentation

## 2020-05-05 DIAGNOSIS — I10 Essential (primary) hypertension: Secondary | ICD-10-CM | POA: Insufficient documentation

## 2020-05-05 DIAGNOSIS — F329 Major depressive disorder, single episode, unspecified: Secondary | ICD-10-CM | POA: Insufficient documentation

## 2020-05-05 DIAGNOSIS — Z87891 Personal history of nicotine dependence: Secondary | ICD-10-CM | POA: Insufficient documentation

## 2020-05-05 HISTORY — PX: BUNIONECTOMY: SHX129

## 2020-05-05 HISTORY — DX: Essential (primary) hypertension: I10

## 2020-05-05 HISTORY — DX: Anxiety disorder, unspecified: F41.9

## 2020-05-05 HISTORY — DX: Depression, unspecified: F32.A

## 2020-05-05 LAB — POCT PREGNANCY, URINE: Preg Test, Ur: NEGATIVE

## 2020-05-05 SURGERY — BUNIONECTOMY
Anesthesia: Monitor Anesthesia Care | Site: Toe | Laterality: Left

## 2020-05-05 MED ORDER — FENTANYL CITRATE (PF) 100 MCG/2ML IJ SOLN
INTRAMUSCULAR | Status: DC | PRN
  Administered 2020-05-05 (×2): 50 ug via INTRAVENOUS

## 2020-05-05 MED ORDER — SODIUM CHLORIDE 0.9 % IV SOLN: INTRAVENOUS | Status: DC

## 2020-05-05 MED ORDER — PROMETHAZINE HCL 25 MG/ML IJ SOLN: 6.2500 mg | INTRAMUSCULAR | Status: DC | PRN

## 2020-05-05 MED ORDER — FENTANYL CITRATE (PF) 100 MCG/2ML IJ SOLN
INTRAMUSCULAR | Status: AC
  Filled 2020-05-05: qty 2

## 2020-05-05 MED ORDER — OXYCODONE HCL 5 MG PO TABS: 5.0000 mg | ORAL_TABLET | Freq: Once | ORAL | Status: DC | PRN

## 2020-05-05 MED ORDER — PROPOFOL 500 MG/50ML IV EMUL
INTRAVENOUS | Status: DC | PRN
Start: 2020-05-05 — End: 2020-05-05
  Administered 2020-05-05: 100 ug/kg/min via INTRAVENOUS

## 2020-05-05 MED ORDER — PROPOFOL 10 MG/ML IV BOLUS
INTRAVENOUS | Status: DC | PRN
  Administered 2020-05-05 (×2): 20 mg via INTRAVENOUS

## 2020-05-05 MED ORDER — MIDAZOLAM HCL 2 MG/2ML IJ SOLN
1.0000 mg | INTRAMUSCULAR | Status: DC | PRN
  Administered 2020-05-05: 2 mg via INTRAVENOUS

## 2020-05-05 MED ORDER — SENNA 8.6 MG PO TABS: 2.0000 | ORAL_TABLET | Freq: Two times a day (BID) | ORAL | 0 refills | Status: AC

## 2020-05-05 MED ORDER — MIDAZOLAM HCL 2 MG/2ML IJ SOLN
INTRAMUSCULAR | Status: AC
  Filled 2020-05-05: qty 2

## 2020-05-05 MED ORDER — OXYCODONE HCL 5 MG/5ML PO SOLN: 5.0000 mg | Freq: Once | ORAL | Status: DC | PRN

## 2020-05-05 MED ORDER — ONDANSETRON HCL 4 MG/2ML IJ SOLN
INTRAMUSCULAR | Status: DC | PRN
  Administered 2020-05-05: 4 mg via INTRAVENOUS

## 2020-05-05 MED ORDER — DOCUSATE SODIUM 100 MG PO CAPS: 100.0000 mg | ORAL_CAPSULE | Freq: Two times a day (BID) | ORAL | 0 refills | Status: AC

## 2020-05-05 MED ORDER — OXYCODONE HCL 5 MG PO TABS: 5.0000 mg | ORAL_TABLET | Freq: Four times a day (QID) | ORAL | 0 refills | Status: AC | PRN

## 2020-05-05 MED ORDER — FENTANYL CITRATE (PF) 100 MCG/2ML IJ SOLN
25.0000 ug | INTRAMUSCULAR | Status: DC | PRN
  Administered 2020-05-05: 50 ug via INTRAVENOUS

## 2020-05-05 MED ORDER — VANCOMYCIN HCL 500 MG IV SOLR
INTRAVENOUS | Status: DC | PRN
  Administered 2020-05-05: 500 mg via TOPICAL

## 2020-05-05 MED ORDER — CEFAZOLIN SODIUM-DEXTROSE 2-4 GM/100ML-% IV SOLN
INTRAVENOUS | Status: AC
  Filled 2020-05-05: qty 100

## 2020-05-05 MED ORDER — LACTATED RINGERS IV SOLN: INTRAVENOUS | Status: DC

## 2020-05-05 MED ORDER — FENTANYL CITRATE (PF) 100 MCG/2ML IJ SOLN
50.0000 ug | INTRAMUSCULAR | Status: DC | PRN
  Administered 2020-05-05: 100 ug via INTRAVENOUS

## 2020-05-05 MED ORDER — BUPIVACAINE-EPINEPHRINE (PF) 0.5% -1:200000 IJ SOLN
INTRAMUSCULAR | Status: DC | PRN
  Administered 2020-05-05: 30 mL via PERINEURAL

## 2020-05-05 MED ORDER — CEFAZOLIN SODIUM-DEXTROSE 2-4 GM/100ML-% IV SOLN
2.0000 g | INTRAVENOUS | Status: AC
  Administered 2020-05-05: 2 g via INTRAVENOUS

## 2020-05-05 MED FILL — oxyCODONE HCL 5 MG TABS: 5 | 5 days supply | Qty: 20 | Fill #0

## 2020-05-05 SURGICAL SUPPLY — 75 items
APL PRP STRL LF DISP 70% ISPRP (MISCELLANEOUS) ×1
BANDAGE ESMARK 6X9 LF (GAUZE/BANDAGES/DRESSINGS) IMPLANT
BLADE AVERAGE 25X9 (BLADE) ×1 IMPLANT
BLADE LONG MED 25X9 (BLADE) IMPLANT
BLADE MICRO SAGITTAL (BLADE) IMPLANT
BLADE MINI RND TIP GREEN BEAV (BLADE) IMPLANT
BLADE OSC/SAG .038X5.5 CUT EDG (BLADE) IMPLANT
BLADE SURG 15 STRL LF DISP TIS (BLADE) ×2 IMPLANT
BLADE SURG 15 STRL SS (BLADE) ×4
BNDG CMPR 9X6 STRL LF SNTH (GAUZE/BANDAGES/DRESSINGS)
BNDG COHESIVE 4X5 TAN STRL (GAUZE/BANDAGES/DRESSINGS) ×2 IMPLANT
BNDG COHESIVE 6X5 TAN STRL LF (GAUZE/BANDAGES/DRESSINGS) IMPLANT
BNDG CONFORM 2 STRL LF (GAUZE/BANDAGES/DRESSINGS) IMPLANT
BNDG CONFORM 3 STRL LF (GAUZE/BANDAGES/DRESSINGS) ×2 IMPLANT
BNDG ESMARK 6X9 LF (GAUZE/BANDAGES/DRESSINGS)
CHLORAPREP W/TINT 26 (MISCELLANEOUS) ×2 IMPLANT
COVER BACK TABLE 60X90IN (DRAPES) ×2 IMPLANT
COVER WAND RF STERILE (DRAPES) IMPLANT
CUFF TOURN SGL QUICK 24 (TOURNIQUET CUFF) ×2
CUFF TOURN SGL QUICK 34 (TOURNIQUET CUFF)
CUFF TRNQT CYL 24X4X16.5-23 (TOURNIQUET CUFF) IMPLANT
CUFF TRNQT CYL 34X4.125X (TOURNIQUET CUFF) IMPLANT
DRAPE EXTREMITY T 121X128X90 (DISPOSABLE) ×2 IMPLANT
DRAPE OEC MINIVIEW 54X84 (DRAPES) ×2 IMPLANT
DRAPE U-SHAPE 47X51 STRL (DRAPES) ×2 IMPLANT
DRSG MEPITEL 4X7.2 (GAUZE/BANDAGES/DRESSINGS) ×2 IMPLANT
DRSG PAD ABDOMINAL 8X10 ST (GAUZE/BANDAGES/DRESSINGS) ×2 IMPLANT
ELECT REM PT RETURN 9FT ADLT (ELECTROSURGICAL) ×2
ELECTRODE REM PT RTRN 9FT ADLT (ELECTROSURGICAL) ×1 IMPLANT
GAUZE SPONGE 4X4 12PLY STRL (GAUZE/BANDAGES/DRESSINGS) ×2 IMPLANT
GLOVE BIO SURGEON STRL SZ8 (GLOVE) ×2 IMPLANT
GLOVE BIOGEL PI IND STRL 7.0 (GLOVE) IMPLANT
GLOVE BIOGEL PI IND STRL 8 (GLOVE) ×2 IMPLANT
GLOVE BIOGEL PI INDICATOR 7.0 (GLOVE) ×2
GLOVE BIOGEL PI INDICATOR 8 (GLOVE) ×2
GLOVE ECLIPSE 8.0 STRL XLNG CF (GLOVE) ×2 IMPLANT
GLOVE SURG SS PI 7.0 STRL IVOR (GLOVE) ×1 IMPLANT
GOWN STRL REUS W/ TWL LRG LVL3 (GOWN DISPOSABLE) ×1 IMPLANT
GOWN STRL REUS W/ TWL XL LVL3 (GOWN DISPOSABLE) ×2 IMPLANT
GOWN STRL REUS W/TWL LRG LVL3 (GOWN DISPOSABLE) ×2
GOWN STRL REUS W/TWL XL LVL3 (GOWN DISPOSABLE) ×4
K-WIRE DBL END .054 LG (WIRE) ×1 IMPLANT
NDL HYPO 25X1 1.5 SAFETY (NEEDLE) IMPLANT
NEEDLE HYPO 22GX1.5 SAFETY (NEEDLE) IMPLANT
NEEDLE HYPO 25X1 1.5 SAFETY (NEEDLE) IMPLANT
NS IRRIG 1000ML POUR BTL (IV SOLUTION) ×2 IMPLANT
PAD CAST 4YDX4 CTTN HI CHSV (CAST SUPPLIES) ×1 IMPLANT
PADDING CAST ABS 4INX4YD NS (CAST SUPPLIES)
PADDING CAST ABS COTTON 4X4 ST (CAST SUPPLIES) IMPLANT
PADDING CAST COTTON 4X4 STRL (CAST SUPPLIES) ×2
PADDING CAST COTTON 6X4 STRL (CAST SUPPLIES) IMPLANT
PENCIL SMOKE EVACUATOR (MISCELLANEOUS) ×2 IMPLANT
SANITIZER HAND PURELL 535ML FO (MISCELLANEOUS) ×2 IMPLANT
SET BASIN DAY SURGERY F.S. (CUSTOM PROCEDURE TRAY) ×2 IMPLANT
SHEET MEDIUM DRAPE 40X70 STRL (DRAPES) ×2 IMPLANT
SLEEVE SCD COMPRESS KNEE MED (MISCELLANEOUS) ×2 IMPLANT
SPLINT FAST PLASTER 5X30 (CAST SUPPLIES)
SPLINT PLASTER CAST FAST 5X30 (CAST SUPPLIES) IMPLANT
SPONGE LAP 18X18 RF (DISPOSABLE) ×2 IMPLANT
STOCKINETTE 6  STRL (DRAPES) ×2
STOCKINETTE 6 STRL (DRAPES) ×1 IMPLANT
SUCTION FRAZIER HANDLE 10FR (MISCELLANEOUS)
SUCTION TUBE FRAZIER 10FR DISP (MISCELLANEOUS) ×1 IMPLANT
SUT ETHILON 3 0 PS 1 (SUTURE) ×2 IMPLANT
SUT MNCRL AB 3-0 PS2 18 (SUTURE) ×2 IMPLANT
SUT VIC AB 0 SH 27 (SUTURE) IMPLANT
SUT VIC AB 2-0 SH 27 (SUTURE) ×2
SUT VIC AB 2-0 SH 27XBRD (SUTURE) IMPLANT
SUT VICRYL 0 UR6 27IN ABS (SUTURE) IMPLANT
SYR BULB EAR ULCER 3OZ GRN STR (SYRINGE) ×2 IMPLANT
SYR CONTROL 10ML LL (SYRINGE) IMPLANT
TOWEL GREEN STERILE FF (TOWEL DISPOSABLE) ×4 IMPLANT
TUBE CONNECTING 20X1/4 (TUBING) IMPLANT
UNDERPAD 30X36 HEAVY ABSORB (UNDERPADS AND DIAPERS) ×2 IMPLANT
YANKAUER SUCT BULB TIP NO VENT (SUCTIONS) IMPLANT

## 2020-05-05 NOTE — Transfer of Care (Signed)
Immediate Anesthesia Transfer of Care Note  Patient: Ann Gordon  Procedure(s) Performed: Left Fifth metatarsal bunionette correction (Left Toe)  Patient Location: PACU  Anesthesia Type:MAC combined with regional for post-op pain  Level of Consciousness: awake, alert  and oriented  Airway & Oxygen Therapy: Patient Spontanous Breathing and Patient connected to face mask oxygen  Post-op Assessment: Report given to RN and Post -op Vital signs reviewed and stable  Post vital signs: Reviewed and stable  Last Vitals:  Vitals Value Taken Time  BP 97/68 05/05/20 0936  Temp 36.4 C 05/05/20 0936  Pulse 88 05/05/20 0938  Resp 25 05/05/20 0938  SpO2 100 % 05/05/20 0938  Vitals shown include unvalidated device data.  Last Pain:  Vitals:   05/05/20 0702  TempSrc: Oral  PainSc: 0-No pain         Complications: No complications documented.

## 2020-05-05 NOTE — Op Note (Signed)
05/05/2020  9:44 AM  PATIENT:  Ann Gordon  44 y.o. female  PRE-OPERATIVE DIAGNOSIS:  Tailor's bunion left foot  POST-OPERATIVE DIAGNOSIS:  Tailor's bunion left foot  Procedure(s): 1.  Excision of left foot tailor's bunion 2.  Left foot AP and oblique radiographs  SURGEON:  Wylene Simmer, MD  ASSISTANT: Mechele Claude, PA-C  ANESTHESIA:   MAC, regional  EBL:  minimal   TOURNIQUET:   Total Tourniquet Time Documented: Calf (Left) - 23 minutes Total: Calf (Left) - 23 minutes  COMPLICATIONS:  None apparent  DISPOSITION:  Extubated, awake and stable to recovery.  INDICATION FOR PROCEDURE: The patient is a 44 year old female without significant past medical history.  She has a long history of left forefoot pain due to a prominent tailor's bunion.  She has failed nonoperative treatment to date including activity modification, oral anti-inflammatories and shoewear modification.  She presents now for surgical treatment of this painful left forefoot deformity.  The risks and benefits of the alternative treatment options have been discussed in detail.  The patient wishes to proceed with surgery and specifically understands risks of bleeding, infection, nerve damage, blood clots, need for additional surgery, amputation and death.  PROCEDURE IN DETAIL: After preoperative consent was obtained and the correct operative site was identified, the patient was brought to the operating room and placed upon the operating table.  IV sedation was administered following regional anesthesia.  The left lower extremity was prepped and draped in standard sterile fashion with a tourniquet around the calf.  Preoperative antibiotics were administered.  A surgical timeout was taken.  The left lower extremity was elevated and the tourniquet inflated to 200 mmHg.  An incision was then made over the lateral aspect of the forefoot adjacent to the tailor's bunion.  Dissection was carried down through the subcutaneous  tissues.  The joint capsule was incised and elevated dorsally and plantarly.  The hypertrophic lateral eminence was identified.  An oscillating saw was used to resect the lateral eminence in line with the metatarsal shaft.  The plantar bone was also trimmed plantar laterally.  AP and oblique radiographs were obtained showing appropriate resection of the tailor's bunion.  The wound was irrigated copiously.  Vancomycin powder was sprinkled in the wound.  The joint capsule was repaired with 2-0 Vicryl simple sutures.  Skin incision was closed with horizontal mattress sutures of 3-0 nylon.  Sterile dressings were applied followed by a compression wrap.  The tourniquet was released after application of the dressings.  The patient was awakened from anesthesia and transported to the recovery room in stable condition.   FOLLOW UP PLAN: Weightbearing as tolerated in a flat postop shoe.  Follow-up in the office in 2 weeks for suture removal.  No indication for DVT prophylaxis in this ambulatory patient.   RADIOGRAPHS: AP and oblique radiographs of the left foot are obtained intraoperatively.  These show interval resection of the hypertrophic lateral eminence in line with the first metatarsal shaft.  No other acute injuries are noted.    Mechele Claude PA-C was present and scrubbed for the duration of the operative case. His assistance was essential in positioning the patient, prepping and draping, gaining and maintaining exposure, performing the operation, closing and dressing the wounds and applying the splint.

## 2020-05-05 NOTE — Anesthesia Procedure Notes (Signed)
Anesthesia Regional Block: Popliteal block   Pre-Anesthetic Checklist: ,, timeout performed, Correct Patient, Correct Site, Correct Laterality, Correct Procedure, Correct Position, site marked, Risks and benefits discussed,  Surgical consent,  Pre-op evaluation,  At surgeon's request and post-op pain management  Laterality: Left  Prep: chloraprep       Needles:  Injection technique: Single-shot  Needle Type: Echogenic Needle     Needle Length: 10cm  Needle Gauge: 21     Additional Needles:   Narrative:  Start time: 05/05/2020 8:07 AM End time: 05/05/2020 8:11 AM Injection made incrementally with aspirations every 5 mL.  Performed by: Personally  Anesthesiologist: Audry Pili, MD  Additional Notes: No pain on injection. No increased resistance to injection. Injection made in 5cc increments. Good needle visualization. Patient tolerated the procedure well.

## 2020-05-05 NOTE — Discharge Instructions (Addendum)
John Hewitt, MD [facility]  Please read the following information regarding your care after surgery.  Medications  You only need a prescription for the narcotic pain medicine (ex. oxycodone, Percocet, Norco).  All of the other medicines listed below are available over the counter. X Aleve 2 pills twice a day for the first 3 days after surgery. X acetominophen (Tylenol) 650 mg every 4-6 hours as you need for minor to moderate pain X oxycodone as prescribed for severe pain  Narcotic pain medicine (ex. oxycodone, Percocet, Vicodin) will cause constipation.  To prevent this problem, take the following medicines while you are taking any pain medicine. X docusate sodium (Colace) 100 mg twice a day X senna (Senokot) 2 tablets twice a day    Weight Bearing X Bear weight only on your operated foot in the post-op shoe.   Cast / Splint / Dressing X Keep your splint, cast or dressing clean and dry.  Don't put anything (coat hanger, pencil, etc) down inside of it.  If it gets damp, use a hair dryer on the cool setting to dry it.  If it gets soaked, call the office to schedule an appointment for a cast change.    After your dressing, cast or splint is removed; you may shower, but do not soak or scrub the wound.  Allow the water to run over it, and then gently pat it dry.  Swelling It is normal for you to have swelling where you had surgery.  To reduce swelling and pain, keep your toes above your nose for at least 3 days after surgery.  It may be necessary to keep your foot or leg elevated for several weeks.  If it hurts, it should be elevated.  Follow Up Call my office at [phone] when you are discharged from the hospital or surgery center to schedule an appointment to be seen two weeks after surgery.  Call my office at [phone] if you develop a fever >101.5 F, nausea, vomiting, bleeding from the surgical site or severe pain.    Post Anesthesia Home Care Instructions  Activity: Get  plenty of rest for the remainder of the day. A responsible individual must stay with you for 24 hours following the procedure.  For the next 24 hours, DO NOT: -Drive a car -Operate machinery -Drink alcoholic beverages -Take any medication unless instructed by your physician -Make any legal decisions or sign important papers.  Meals: Start with liquid foods such as gelatin or soup. Progress to regular foods as tolerated. Avoid greasy, spicy, heavy foods. If nausea and/or vomiting occur, drink only clear liquids until the nausea and/or vomiting subsides. Call your physician if vomiting continues.  Special Instructions/Symptoms: Your throat may feel dry or sore from the anesthesia or the breathing tube placed in your throat during surgery. If this causes discomfort, gargle with warm salt water. The discomfort should disappear within 24 hours.   Regional Anesthesia Blocks  1. Numbness or the inability to move the "blocked" extremity may last from 3-48 hours after placement. The length of time depends on the medication injected and your individual response to the medication. If the numbness is not going away after 48 hours, call your surgeon.  2. The extremity that is blocked will need to be protected until the numbness is gone and the  Strength has returned. Because you cannot feel it, you will need to take extra care to avoid injury. Because it may be weak, you may have difficulty moving it or using it.   You may not know what position it is in without looking at it while the block is in effect.  3. For blocks in the legs and feet, returning to weight bearing and walking needs to be done carefully. You will need to wait until the numbness is entirely gone and the strength has returned. You should be able to move your leg and foot normally before you try and bear weight or walk. You will need someone to be with you when you first try to ensure you do not fall and possibly risk injury.  4. Bruising  and tenderness at the needle site are common side effects and will resolve in a few days.  5. Persistent numbness or new problems with movement should be communicated to the surgeon or the [facility] ([phone])/ [facility] ([phone]).         

## 2020-05-05 NOTE — Anesthesia Preprocedure Evaluation (Addendum)
Anesthesia Evaluation  Patient identified by MRN, date of birth, ID band Patient awake    Reviewed: Allergy & Precautions, NPO status , Patient's Chart, lab work & pertinent test results, reviewed documented beta blocker date and time   History of Anesthesia Complications Negative for: history of anesthetic complications  Airway Mallampati: II  TM Distance: >3 FB Neck ROM: Full    Dental  (+) Dental Advisory Given   Pulmonary former smoker,    Pulmonary exam normal        Cardiovascular hypertension, Pt. on medications and Pt. on home beta blockers Normal cardiovascular exam     Neuro/Psych PSYCHIATRIC DISORDERS Anxiety Depression negative neurological ROS     GI/Hepatic negative GI ROS, Neg liver ROS,   Endo/Other   Obesity   Renal/GU negative Renal ROS     Musculoskeletal  (+) Arthritis ,   Abdominal   Peds  Hematology negative hematology ROS (+)   Anesthesia Other Findings Covid test negative 6/15  Reproductive/Obstetrics                            Anesthesia Physical Anesthesia Plan  ASA: II  Anesthesia Plan: MAC and Regional   Post-op Pain Management:    Induction:   PONV Risk Score and Plan: 3 and Treatment may vary due to age or medical condition, Ondansetron, Dexamethasone and Midazolam  Airway Management Planned: Natural Airway and Simple Face Mask  Additional Equipment: None  Intra-op Plan:   Post-operative Plan:   Informed Consent: I have reviewed the patients History and Physical, chart, labs and discussed the procedure including the risks, benefits and alternatives for the proposed anesthesia with the patient or authorized representative who has indicated his/her understanding and acceptance.       Plan Discussed with: CRNA and Anesthesiologist  Anesthesia Plan Comments:        Anesthesia Quick Evaluation

## 2020-05-05 NOTE — Anesthesia Postprocedure Evaluation (Signed)
Anesthesia Post Note  Patient: Ann Gordon  Procedure(s) Performed: Left Fifth metatarsal bunionette correction (Left Toe)     Patient location during evaluation: PACU Anesthesia Type: Regional Level of consciousness: awake and alert Pain management: pain level controlled Vital Signs Assessment: post-procedure vital signs reviewed and stable Respiratory status: spontaneous breathing, nonlabored ventilation and respiratory function stable Cardiovascular status: stable and blood pressure returned to baseline Anesthetic complications: no   No complications documented.  Last Vitals:  Vitals:   05/05/20 1000 05/05/20 1015  BP: (!) 105/59 109/67  Pulse: 73 65  Resp: 16 16  Temp:    SpO2: 100% 100%    Last Pain:  Vitals:   05/05/20 1027  TempSrc:   PainSc: [place]

## 2020-05-05 NOTE — H&P (Signed)
Ann Gordon is an 44 y.o. female.   Chief Complaint: Left forefoot pain HPI: The patient is a 44 year old female without significant past medical history.  She has a long history of left lateral forefoot pain due to a prominent bunionette.  She has failed nonoperative treatment to date including activity modification, oral anti-inflammatories, shoewear modification and callus paring.  She presents now for operative treatment of this painful condition.  Past Medical History:  Diagnosis Date  . Anxiety   . Depression   . Hypertension   . Seasonal allergies     Past Surgical History:  Procedure Laterality Date  . BREAST SURGERY    . FOOT SURGERY Left     Family History  Problem Relation Age of Onset  . Hypertension Mother   . Hypertension Father   . Diabetes Father    Social History:  reports that she has quit smoking. She has never used smokeless tobacco. She reports current alcohol use of about 2.0 - 3.0 standard drinks of alcohol per week. She reports that she does not use drugs.  Allergies:  Allergies  Allergen Reactions  . Cashew Nut Oil Anaphylaxis and Itching  . Other Anaphylaxis    Tree nuts    Medications Prior to Admission  Medication Sig Dispense Refill  . fluticasone (FLONASE) 50 MCG/ACT nasal spray     . hydrochlorothiazide (HYDRODIURIL) 12.5 MG tablet TAKE ONE TABLET (12.5 MG DOSE) BY MOUTH EVERY MORNING. 90 tablet 2  . levocetirizine (XYZAL) 5 MG tablet Take 1 tablet (5 mg total) by mouth every evening. 90 tablet 2  . metoprolol succinate (TOPROL-XL) 25 MG 24 hr tablet Take 1 tablet (25 mg total) by mouth daily. 90 tablet 2  . Multiple Vitamin (MULTIVITAMIN) tablet Take 1 tablet by mouth daily.    Marland Kitchen buPROPion (WELLBUTRIN XL) 150 MG 24 hr tablet Take 1 tab daily. 90 tablet 3    Results for orders placed or performed during the hospital encounter of 05/05/20 (from the past 48 hour(s))  Basic metabolic panel     Status: None   Collection Time: 05/04/20 12:00  PM  Result Value Ref Range   Sodium 138 135 - 145 mmol/L   Potassium 4.3 3.5 - 5.1 mmol/L   Chloride 104 98 - 111 mmol/L   CO2 25 22 - 32 mmol/L   Glucose, Bld 98 70 - 99 mg/dL    Comment: Glucose reference range applies only to samples taken after fasting for at least 8 hours.   BUN 7 6 - 20 mg/dL   Creatinine, Ser 0.70 0.44 - 1.00 mg/dL   Calcium 9.7 8.9 - 10.3 mg/dL   GFR calc non Af Amer >60 >60 mL/min   GFR calc Af Amer >60 >60 mL/min   Anion gap 9 5 - 15    Comment: Performed at [facility] [address], [place], [place] [address]  Pregnancy, urine POC     Status: None   Collection Time: 05/05/20  6:50 AM  Result Value Ref Range   Preg Test, Ur NEGATIVE NEGATIVE    Comment:        THE SENSITIVITY OF THIS METHODOLOGY IS >24 mIU/mL    No results found.  Review of Systems no recent fever, chills, nausea, vomiting or changes in her appetite  Blood pressure 113/72, pulse 78, temperature 98.3 F (36.8 C), temperature source Oral, resp. rate 10, height 5\' 3"  (1.6 m), weight 85.7 kg, last menstrual period 04/15/2020, SpO2 100 %. Physical Exam  Well-nourished well-developed woman in no apparent distress.  Alert and oriented x4.  Normal mood and affect.  Extraocular motions are intact.  Respirations are unlabored.  Gait is normal.  Left foot has a prominent bunionette deformity with thick plantar callus.  Pulses are palpable in the foot.  Normal sensibility to light touch in the sural nerve distribution.  Assessment/Plan Left foot painful bunionette -to the operating room today for excision of the bunionette deformity.The risks and benefits of the alternative treatment options have been discussed in detail.  The patient wishes to proceed with surgery and specifically understands risks of bleeding, infection, nerve damage, blood clots, need for additional surgery, amputation and death.   Wylene Simmer, MD 05/26/20, 8:38 AM

## 2020-05-05 NOTE — Progress Notes (Signed)
Assisted Dr. Brock with left, ultrasound guided, popliteal block. Side rails up, monitors on throughout procedure. See vital signs in flow sheet. Tolerated Procedure well. 

## 2020-05-06 ENCOUNTER — Encounter (HOSPITAL_BASED_OUTPATIENT_CLINIC_OR_DEPARTMENT_OTHER): Payer: Self-pay | Admitting: Orthopedic Surgery

## 2020-05-21 MED FILL — buPROPion HCL ER (XL) 150 M: 150 | 90 days supply | Qty: 90 | Fill #0

## 2020-06-17 ENCOUNTER — Other Ambulatory Visit (HOSPITAL_COMMUNITY): Payer: Self-pay | Admitting: Student

## 2020-06-17 MED FILL — LIDOCAINE PAIN RELIEF 4 % P: 4 | 5 days supply | Qty: 5 | Fill #0

## 2020-06-27 MED FILL — LEVOCETIRIZINE 5 MG TABLET: 5 | 90 days supply | Qty: 90 | Fill #1

## 2020-06-27 MED FILL — FLUCONAZOLE 150 MG TABS: 150 | 1 days supply | Qty: 1 | Fill #0

## 2020-06-27 MED FILL — METOPROLOL SUCCINATE ER 25: 25 | 90 days supply | Qty: 90 | Fill #1

## 2020-06-27 MED FILL — HYDROCHLOROTHIAZIDE 12.5 MG: 12.5 | 90 days supply | Qty: 90 | Fill #1

## 2020-06-27 MED FILL — LIDOCAINE PAIN RELIEF 4 % P: 4 | 5 days supply | Qty: 5 | Fill #0

## 2020-09-27 MED FILL — HYDROCHLOROTHIAZIDE 12.5 MG: 12.5 | 90 days supply | Qty: 90 | Fill #2

## 2020-09-27 MED FILL — LEVOCETIRIZINE 5 MG TABLET: 5 | 90 days supply | Qty: 90 | Fill #2

## 2020-09-27 MED FILL — LIDOCAINE PAIN RELIEF 4 % P: 4 | 5 days supply | Qty: 5 | Fill #1

## 2020-10-29 MED FILL — METOPROLOL SUCCINATE ER 25: 25 | 90 days supply | Qty: 90 | Fill #2

## 2020-12-02 DIAGNOSIS — H6123 Impacted cerumen, bilateral: Secondary | ICD-10-CM | POA: Diagnosis not present

## 2020-12-02 DIAGNOSIS — H66005 Acute suppurative otitis media without spontaneous rupture of ear drum, recurrent, left ear: Secondary | ICD-10-CM | POA: Diagnosis not present

## 2020-12-02 DIAGNOSIS — I10 Essential (primary) hypertension: Secondary | ICD-10-CM | POA: Diagnosis not present

## 2020-12-02 DIAGNOSIS — K581 Irritable bowel syndrome with constipation: Secondary | ICD-10-CM | POA: Diagnosis not present

## 2020-12-24 MED FILL — FLUCONAZOLE 150 MG TABS: 150 | 1 days supply | Qty: 1 | Fill #1

## 2020-12-24 MED FILL — LIDOCAINE PAIN RELIEF 4 % P: 4 | 5 days supply | Qty: 5 | Fill #2

## 2020-12-30 DIAGNOSIS — Z1231 Encounter for screening mammogram for malignant neoplasm of breast: Secondary | ICD-10-CM | POA: Diagnosis not present

## 2021-02-06 MED FILL — FLUCONAZOLE 150 MG TABS: 150 | 1 days supply | Qty: 1 | Fill #2

## 2021-07-07 ENCOUNTER — Other Ambulatory Visit (HOSPITAL_COMMUNITY): Payer: Self-pay

## 2021-07-07 MED ORDER — LEVOCETIRIZINE DIHYDROCHLORIDE 5 MG PO TABS
5.0000 mg | ORAL_TABLET | Freq: Every day | ORAL | 5 refills | Status: DC
  Filled 2021-07-07: qty 90, 90d supply, fill #0
  Filled 2021-11-01: qty 90, 90d supply, fill #1
  Filled 2022-01-27: qty 90, 90d supply, fill #2
  Filled 2022-05-03: qty 90, 90d supply, fill #3

## 2021-07-11 ENCOUNTER — Other Ambulatory Visit (HOSPITAL_COMMUNITY): Payer: Self-pay

## 2021-07-11 ENCOUNTER — Other Ambulatory Visit: Payer: Self-pay | Admitting: Family Medicine

## 2021-07-11 ENCOUNTER — Other Ambulatory Visit: Payer: Self-pay

## 2021-07-12 ENCOUNTER — Other Ambulatory Visit (HOSPITAL_COMMUNITY): Payer: Self-pay

## 2021-07-12 ENCOUNTER — Other Ambulatory Visit: Payer: Self-pay

## 2021-07-12 MED ORDER — LINZESS 72 MCG PO CAPS
ORAL_CAPSULE | ORAL | 3 refills | Status: AC
  Filled 2021-07-12 – 2021-07-22 (×2): qty 30, 30d supply, fill #0
  Filled 2022-01-27: qty 30, 30d supply, fill #1

## 2021-07-13 ENCOUNTER — Other Ambulatory Visit (HOSPITAL_COMMUNITY): Payer: Self-pay

## 2021-07-13 MED ORDER — HYDROCHLOROTHIAZIDE 12.5 MG PO TABS
12.5000 mg | ORAL_TABLET | Freq: Every morning | ORAL | 2 refills | Status: AC
  Filled 2021-07-13 – 2021-07-22 (×2): qty 90, 90d supply, fill #0
  Filled 2021-11-01: qty 90, 90d supply, fill #1
  Filled 2022-01-27: qty 90, 90d supply, fill #2

## 2021-07-20 ENCOUNTER — Other Ambulatory Visit (HOSPITAL_COMMUNITY): Payer: Self-pay

## 2021-07-22 ENCOUNTER — Other Ambulatory Visit (HOSPITAL_COMMUNITY): Payer: Self-pay

## 2021-07-27 ENCOUNTER — Other Ambulatory Visit (HOSPITAL_COMMUNITY): Payer: Self-pay

## 2021-09-05 ENCOUNTER — Other Ambulatory Visit (HOSPITAL_COMMUNITY): Payer: Self-pay

## 2021-09-05 DIAGNOSIS — J301 Allergic rhinitis due to pollen: Secondary | ICD-10-CM | POA: Diagnosis not present

## 2021-09-05 DIAGNOSIS — N939 Abnormal uterine and vaginal bleeding, unspecified: Secondary | ICD-10-CM | POA: Diagnosis not present

## 2021-09-05 DIAGNOSIS — I10 Essential (primary) hypertension: Secondary | ICD-10-CM | POA: Diagnosis not present

## 2021-09-05 DIAGNOSIS — K581 Irritable bowel syndrome with constipation: Secondary | ICD-10-CM | POA: Diagnosis not present

## 2021-09-05 DIAGNOSIS — H6123 Impacted cerumen, bilateral: Secondary | ICD-10-CM | POA: Diagnosis not present

## 2021-09-05 MED ORDER — HYDROCHLOROTHIAZIDE 12.5 MG PO TABS
12.5000 mg | ORAL_TABLET | Freq: Every morning | ORAL | 2 refills | Status: AC
  Filled 2021-09-05 – 2022-05-03 (×2): qty 90, 90d supply, fill #0

## 2021-09-05 MED ORDER — LINZESS 72 MCG PO CAPS
ORAL_CAPSULE | ORAL | 3 refills | Status: AC
  Filled 2021-09-05: qty 90, 90d supply, fill #0

## 2021-09-13 ENCOUNTER — Other Ambulatory Visit (HOSPITAL_COMMUNITY): Payer: Self-pay

## 2021-09-18 ENCOUNTER — Other Ambulatory Visit (HOSPITAL_COMMUNITY): Payer: Self-pay

## 2021-09-18 MED ORDER — EPINEPHRINE 0.3 MG/0.3ML IJ SOAJ
INTRAMUSCULAR | 1 refills | Status: AC
  Filled 2021-09-18: qty 2, 28d supply, fill #0
  Filled 2021-11-02: qty 2, 30d supply, fill #0

## 2021-09-26 ENCOUNTER — Other Ambulatory Visit (HOSPITAL_COMMUNITY): Payer: Self-pay

## 2021-11-01 ENCOUNTER — Other Ambulatory Visit (HOSPITAL_COMMUNITY): Payer: Self-pay

## 2021-11-01 DIAGNOSIS — I10 Essential (primary) hypertension: Secondary | ICD-10-CM | POA: Diagnosis not present

## 2021-11-01 DIAGNOSIS — Z124 Encounter for screening for malignant neoplasm of cervix: Secondary | ICD-10-CM | POA: Diagnosis not present

## 2021-11-01 DIAGNOSIS — E559 Vitamin D deficiency, unspecified: Secondary | ICD-10-CM | POA: Diagnosis not present

## 2021-11-01 DIAGNOSIS — Z0000 Encounter for general adult medical examination without abnormal findings: Secondary | ICD-10-CM | POA: Diagnosis not present

## 2021-11-01 DIAGNOSIS — Z01419 Encounter for gynecological examination (general) (routine) without abnormal findings: Secondary | ICD-10-CM | POA: Diagnosis not present

## 2021-11-01 DIAGNOSIS — J0141 Acute recurrent pansinusitis: Secondary | ICD-10-CM | POA: Diagnosis not present

## 2021-11-01 MED ORDER — FLUCONAZOLE 150 MG PO TABS
150.0000 mg | ORAL_TABLET | Freq: Once | ORAL | 0 refills | Status: AC
  Filled 2021-11-01: qty 1, 1d supply, fill #0

## 2021-11-01 MED ORDER — AZELASTINE HCL 0.1 % NA SOLN
NASAL | 0 refills | Status: DC
  Filled 2021-11-01: qty 30, 50d supply, fill #0

## 2021-11-01 MED ORDER — AMOXICILLIN 875 MG PO TABS
ORAL_TABLET | ORAL | 0 refills | Status: AC
  Filled 2021-11-01: qty 20, 10d supply, fill #0

## 2021-11-02 ENCOUNTER — Other Ambulatory Visit (HOSPITAL_COMMUNITY): Payer: Self-pay

## 2021-11-02 DIAGNOSIS — J3489 Other specified disorders of nose and nasal sinuses: Secondary | ICD-10-CM | POA: Diagnosis not present

## 2021-11-02 DIAGNOSIS — J343 Hypertrophy of nasal turbinates: Secondary | ICD-10-CM | POA: Diagnosis not present

## 2021-11-02 DIAGNOSIS — J309 Allergic rhinitis, unspecified: Secondary | ICD-10-CM | POA: Diagnosis not present

## 2021-11-02 MED ORDER — PREDNISONE 10 MG (48) PO TBPK
ORAL_TABLET | ORAL | 0 refills | Status: AC
  Filled 2021-11-02: qty 48, 12d supply, fill #0

## 2021-11-03 ENCOUNTER — Other Ambulatory Visit (HOSPITAL_COMMUNITY): Payer: Self-pay

## 2021-11-22 ENCOUNTER — Other Ambulatory Visit (HOSPITAL_COMMUNITY): Payer: Self-pay

## 2021-11-23 ENCOUNTER — Other Ambulatory Visit (HOSPITAL_COMMUNITY): Payer: Self-pay

## 2021-11-24 ENCOUNTER — Other Ambulatory Visit (HOSPITAL_COMMUNITY): Payer: Self-pay

## 2021-12-12 ENCOUNTER — Other Ambulatory Visit (HOSPITAL_COMMUNITY): Payer: Self-pay

## 2021-12-12 MED ORDER — AZELASTINE HCL 137 MCG/SPRAY NA SOLN
NASAL | 0 refills | Status: AC
  Filled 2021-12-12: qty 30, 50d supply, fill #0
  Filled 2022-01-27: qty 30, 30d supply, fill #0

## 2021-12-20 ENCOUNTER — Other Ambulatory Visit (HOSPITAL_COMMUNITY): Payer: Self-pay

## 2022-01-27 ENCOUNTER — Other Ambulatory Visit (HOSPITAL_COMMUNITY): Payer: Self-pay

## 2022-01-29 ENCOUNTER — Other Ambulatory Visit (HOSPITAL_COMMUNITY): Payer: Self-pay

## 2022-02-12 DIAGNOSIS — J343 Hypertrophy of nasal turbinates: Secondary | ICD-10-CM | POA: Diagnosis not present

## 2022-02-12 DIAGNOSIS — M1712 Unilateral primary osteoarthritis, left knee: Secondary | ICD-10-CM | POA: Diagnosis not present

## 2022-02-12 DIAGNOSIS — M25562 Pain in left knee: Secondary | ICD-10-CM | POA: Diagnosis not present

## 2022-02-15 DIAGNOSIS — Z1231 Encounter for screening mammogram for malignant neoplasm of breast: Secondary | ICD-10-CM | POA: Diagnosis not present

## 2022-03-26 DIAGNOSIS — M1712 Unilateral primary osteoarthritis, left knee: Secondary | ICD-10-CM | POA: Diagnosis not present

## 2022-03-26 DIAGNOSIS — J309 Allergic rhinitis, unspecified: Secondary | ICD-10-CM | POA: Diagnosis not present

## 2022-03-26 DIAGNOSIS — J343 Hypertrophy of nasal turbinates: Secondary | ICD-10-CM | POA: Diagnosis not present

## 2022-04-27 DIAGNOSIS — B078 Other viral warts: Secondary | ICD-10-CM | POA: Diagnosis not present

## 2022-04-27 DIAGNOSIS — D485 Neoplasm of uncertain behavior of skin: Secondary | ICD-10-CM | POA: Diagnosis not present

## 2022-04-27 DIAGNOSIS — L821 Other seborrheic keratosis: Secondary | ICD-10-CM | POA: Diagnosis not present

## 2022-04-27 DIAGNOSIS — L700 Acne vulgaris: Secondary | ICD-10-CM | POA: Diagnosis not present

## 2022-05-03 ENCOUNTER — Other Ambulatory Visit (HOSPITAL_COMMUNITY): Payer: Self-pay

## 2022-08-22 ENCOUNTER — Other Ambulatory Visit (HOSPITAL_COMMUNITY): Payer: Self-pay

## 2022-08-23 ENCOUNTER — Other Ambulatory Visit (HOSPITAL_COMMUNITY): Payer: Self-pay

## 2022-08-23 MED ORDER — LEVOCETIRIZINE DIHYDROCHLORIDE 5 MG PO TABS
5.0000 mg | ORAL_TABLET | Freq: Every day | ORAL | 5 refills | Status: AC
  Filled 2022-08-23: qty 90, 90d supply, fill #0
  Filled 2022-11-16 – 2022-11-26 (×2): qty 90, 90d supply, fill #1
  Filled 2023-02-25: qty 90, 90d supply, fill #2
  Filled 2023-06-14: qty 90, 90d supply, fill #3

## 2022-09-27 DIAGNOSIS — H6990 Unspecified Eustachian tube disorder, unspecified ear: Secondary | ICD-10-CM | POA: Diagnosis not present

## 2022-09-27 DIAGNOSIS — H6123 Impacted cerumen, bilateral: Secondary | ICD-10-CM | POA: Diagnosis not present

## 2022-11-16 ENCOUNTER — Other Ambulatory Visit (HOSPITAL_COMMUNITY): Payer: Self-pay

## 2022-11-21 ENCOUNTER — Encounter (HOSPITAL_COMMUNITY): Payer: Self-pay

## 2022-11-21 ENCOUNTER — Other Ambulatory Visit (HOSPITAL_COMMUNITY): Payer: Self-pay

## 2022-11-22 ENCOUNTER — Other Ambulatory Visit: Payer: Self-pay

## 2022-11-26 ENCOUNTER — Other Ambulatory Visit (HOSPITAL_COMMUNITY): Payer: Self-pay

## 2022-12-12 ENCOUNTER — Other Ambulatory Visit: Payer: Self-pay

## 2023-02-26 ENCOUNTER — Other Ambulatory Visit: Payer: Self-pay

## 2023-02-26 ENCOUNTER — Other Ambulatory Visit (HOSPITAL_COMMUNITY): Payer: Self-pay

## 2023-06-14 ENCOUNTER — Other Ambulatory Visit (HOSPITAL_COMMUNITY): Payer: Self-pay

## 2023-10-21 ENCOUNTER — Other Ambulatory Visit (HOSPITAL_COMMUNITY): Payer: Self-pay

## 2023-10-21 MED ORDER — MELOXICAM 15 MG PO TABS
15.0000 mg | ORAL_TABLET | Freq: Every day | ORAL | 0 refills | Status: AC
  Filled 2023-10-21: qty 30, 30d supply, fill #0

## 2023-10-22 ENCOUNTER — Other Ambulatory Visit: Payer: Self-pay

## 2023-10-23 ENCOUNTER — Other Ambulatory Visit (HOSPITAL_COMMUNITY): Payer: Self-pay

## 2023-10-24 ENCOUNTER — Other Ambulatory Visit: Payer: Self-pay
